# Patient Record
Sex: Female | Born: 1965 | Race: White | Hispanic: No | Marital: Married | Smoking: Former smoker
Health system: Southern US, Community
[De-identification: ages and names within clinical notes are randomized; demographics above are authoritative.]

## PROBLEM LIST (undated history)

## (undated) DIAGNOSIS — G2581 Restless legs syndrome: Secondary | ICD-10-CM

## (undated) DIAGNOSIS — I10 Essential (primary) hypertension: Secondary | ICD-10-CM

## (undated) DIAGNOSIS — I69398 Other sequelae of cerebral infarction: Principal | ICD-10-CM

## (undated) DIAGNOSIS — E119 Type 2 diabetes mellitus without complications: Secondary | ICD-10-CM

## (undated) DIAGNOSIS — K219 Gastro-esophageal reflux disease without esophagitis: Secondary | ICD-10-CM

## (undated) DIAGNOSIS — D509 Iron deficiency anemia, unspecified: Secondary | ICD-10-CM

## (undated) DIAGNOSIS — R000 Tachycardia, unspecified: Secondary | ICD-10-CM

## (undated) DIAGNOSIS — I69359 Hemiplegia and hemiparesis following cerebral infarction affecting unspecified side: Secondary | ICD-10-CM

## (undated) HISTORY — DX: Other sequelae of cerebral infarction: I69.398

## (undated) HISTORY — DX: Essential (primary) hypertension: I10

## (undated) HISTORY — DX: Hemiplegia and hemiparesis following cerebral infarction affecting unspecified side: I69.359

## (undated) HISTORY — DX: Restless legs syndrome: G25.81

## (undated) HISTORY — DX: Type 2 diabetes mellitus without complications: E11.9

## (undated) HISTORY — DX: Iron deficiency anemia, unspecified: D50.9

## (undated) HISTORY — DX: Gastro-esophageal reflux disease without esophagitis: K21.9

## (undated) HISTORY — DX: Tachycardia, unspecified: R00.0

---

## 2012-11-21 ENCOUNTER — Encounter (INDEPENDENT_AMBULATORY_CARE_PROVIDER_SITE_OTHER): Payer: Self-pay

## 2012-11-21 DIAGNOSIS — D509 Iron deficiency anemia, unspecified: Secondary | ICD-10-CM

## 2012-11-21 DIAGNOSIS — I890 Lymphedema, not elsewhere classified: Secondary | ICD-10-CM

## 2012-11-26 DIAGNOSIS — D509 Iron deficiency anemia, unspecified: Secondary | ICD-10-CM

## 2012-12-03 DIAGNOSIS — D509 Iron deficiency anemia, unspecified: Secondary | ICD-10-CM

## 2015-06-02 ENCOUNTER — Other Ambulatory Visit: Payer: Self-pay | Admitting: *Deleted

## 2015-06-02 ENCOUNTER — Encounter: Payer: Self-pay | Admitting: *Deleted

## 2015-06-03 ENCOUNTER — Encounter: Payer: Self-pay | Admitting: *Deleted

## 2015-06-03 ENCOUNTER — Ambulatory Visit (INDEPENDENT_AMBULATORY_CARE_PROVIDER_SITE_OTHER): Payer: BLUE CROSS/BLUE SHIELD | Admitting: Cardiovascular Disease

## 2015-06-03 ENCOUNTER — Encounter: Payer: Self-pay | Admitting: Cardiovascular Disease

## 2015-06-03 VITALS — BP 122/76 | HR 102 | Ht 67.5 in | Wt 242.0 lb

## 2015-06-03 DIAGNOSIS — I25118 Atherosclerotic heart disease of native coronary artery with other forms of angina pectoris: Secondary | ICD-10-CM | POA: Diagnosis not present

## 2015-06-03 DIAGNOSIS — R0602 Shortness of breath: Secondary | ICD-10-CM

## 2015-06-03 DIAGNOSIS — R5383 Other fatigue: Secondary | ICD-10-CM

## 2015-06-03 DIAGNOSIS — R000 Tachycardia, unspecified: Secondary | ICD-10-CM

## 2015-06-03 DIAGNOSIS — I10 Essential (primary) hypertension: Secondary | ICD-10-CM

## 2015-06-03 DIAGNOSIS — D638 Anemia in other chronic diseases classified elsewhere: Secondary | ICD-10-CM

## 2015-06-03 DIAGNOSIS — E785 Hyperlipidemia, unspecified: Secondary | ICD-10-CM

## 2015-06-03 DIAGNOSIS — Z955 Presence of coronary angioplasty implant and graft: Secondary | ICD-10-CM

## 2015-06-03 MED ORDER — ASPIRIN EC 81 MG PO TBEC: 81.0000 mg | DELAYED_RELEASE_TABLET | Freq: Every day | ORAL | Status: DC

## 2015-06-03 NOTE — Progress Notes (Signed)
Patient ID: Rachel Rivers, female   DOB: 1965/11/25, 50 y.o.   MRN: [id number]       CARDIOLOGY CONSULT NOTE  Patient ID: Rachel Rivers MRN: [id number] DOB/AGE: [birth year] 50 y.o.  Admit date: (Not on file) Primary Physician Selinda Flavin, MD  Reason for Consultation: tachycardia, fatigue  HPI: The patient is a 50 year old obese female with a history of hypertension and insulin-dependent diabetes mellitus who is referred for the evaluation of fatigue, SOB, and tachycardia. She also has anemia. She has CAD with a h/o LAD and left circumflex stents.  Labs on 05/13/15 showed Hgb 9.3, plts 370, BUN 27, creatinine 0.99, K 4, Hgb A1C 8.5%, TSH 5.7, TC 178, TG 666, HDL 27.  She received iron last night and feels a mild increase in both energy levels and decreased shortness of breath. She said she had 5 coronary angiograms with the most recent one being 10 years ago at which time she had a second stent placed in her left circumflex for reocclusion. All of these were performed in [place] and [place], [place]. She moved to [place] 4 years ago. Prior to stent placement, symptoms included shortness of breath, nausea, diaphoresis, and chest pain. She had gastric bypass surgery several years ago. She had 6 feet of intestines removed and his had malabsorption problems ever since then. 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  ECG performed in the office today and she was normal sinus rhythm with no ischemic ST segment or T-wave abnormalities, heart rate 93 bpm.  Fam: Father died of 4th MI at 54, 1st MI at 9.    Allergies  Allergen Reactions  . Iodinated Diagnostic Agents Hives  . Nsaids   . Statins Other (See Comments)    Myalgias:atorvastatin    Current Outpatient Prescriptions  Medication Sig Dispense Refill  . aspirin 325 MG tablet Take 1 tablet by mouth daily.    .  clopidogrel (PLAVIX) 75 MG tablet Take 75 mg by mouth daily.  99  . diltiazem (CARDIZEM CD) 240 MG 24 hr capsule Take 240 mg by mouth every morning.  3  . glipiZIDE (GLUCOTROL XL) 10 MG 24 hr tablet Take 10 mg by mouth every morning.  99  . hydrochlorothiazide (HYDRODIURIL) 25 MG tablet Take 25 mg by mouth daily.  99  . HYDROcodone-acetaminophen (NORCO/VICODIN) 5-325 MG tablet Take 1 tablet by mouth every 6 (six) hours as needed.   0  . LANTUS SOLOSTAR 100 UNIT/ML Solostar Pen Inject 60 Units into the skin 2 (two) times daily.  99  . metFORMIN (GLUCOPHAGE) 1000 MG tablet Take 1,000 mg by mouth 2 (two) times daily.  99  . metoprolol succinate (TOPROL-XL) 100 MG 24 hr tablet Take 150 mg by mouth 2 (two) times daily. 200 mg am / 100 mg pm  99  . Omega-3 Fatty Acids (FISH OIL) 1200 MG CPDR Take by mouth.    . pantoprazole (PROTONIX) 40 MG tablet Take 40 mg by mouth 2 (two) times daily.  99  . quinapril (ACCUPRIL) 20 MG tablet Take 20 mg by mouth daily.  99  . simvastatin (ZOCOR) 20 MG tablet Take 20 mg by mouth at bedtime.  3   No current facility-administered medications for this visit.    Past Medical History  Diagnosis Date  . Type 2 diabetes mellitus (HCC)   . Restless leg syndrome   .  Hypertension   . GERD (gastroesophageal reflux disease)   . Tachycardia   . Iron deficiency anemia     No past surgical history on file.  Social History   Social History  . Marital Status: Married    Spouse Name: N/A  . Number of Children: N/A  . Years of Education: N/A   Occupational History  . Not on file.   Social History Main Topics  . Smoking status: Former Smoker    Quit date: 03/04/1980  . Smokeless tobacco: Not on file  . Alcohol Use: Not on file  . Drug Use: Not on file  . Sexual Activity: Not on file   Other Topics Concern  . Not on file   Social History Narrative      Prior to Admission medications   Medication Sig Start Date End Date Taking? Authorizing Provider    aspirin 325 MG tablet Take 1 tablet by mouth daily.    Historical Provider, MD  clopidogrel (PLAVIX) 75 MG tablet Take 75 mg by mouth daily. 05/18/15   Historical Provider, MD  diltiazem (CARDIZEM CD) 240 MG 24 hr capsule Take 240 mg by mouth every morning. 05/31/15   Historical Provider, MD  glipiZIDE (GLUCOTROL XL) 10 MG 24 hr tablet Take 10 mg by mouth every morning. 05/18/15   Historical Provider, MD  hydrochlorothiazide (HYDRODIURIL) 25 MG tablet Take 25 mg by mouth daily. 05/06/15   Historical Provider, MD  HYDROcodone-acetaminophen (NORCO/VICODIN) 5-325 MG tablet Take 1 tablet by mouth 3 (three) times daily. 05/12/15   Historical Provider, MD  LANTUS SOLOSTAR 100 UNIT/ML Solostar Pen Inject 60 Units into the skin 2 (two) times daily. 05/31/15   Historical Provider, MD  metFORMIN (GLUCOPHAGE) 1000 MG tablet Take 1,000 mg by mouth 2 (two) times daily. 04/28/15   Historical Provider, MD  metoprolol succinate (TOPROL-XL) 100 MG 24 hr tablet Take 150 mg by mouth 2 (two) times daily. 05/31/15   Historical Provider, MD  pantoprazole (PROTONIX) 40 MG tablet Take 40 mg by mouth 2 (two) times daily. 05/06/15   Historical Provider, MD  quinapril (ACCUPRIL) 20 MG tablet Take 20 mg by mouth daily. 05/31/15   Historical Provider, MD  simvastatin (ZOCOR) 20 MG tablet Take 20 mg by mouth at bedtime. 05/11/15   Historical Provider, MD     Review of systems complete and found to be negative unless listed above in HPI     Physical exam Blood pressure 122/76, pulse 102, height 5' 7.5" (1.715 m), weight 242 lb (109.77 kg), SpO2 98 %. General: NAD Neck: No JVD, no thyromegaly or thyroid nodule.  Lungs: Clear to auscultation bilaterally with normal respiratory effort. CV: Nondisplaced PMI. Regular rate and rhythm, normal S1/S2, no S3/S4, no murmur.  No peripheral edema.  No carotid bruit.    Abdomen: Soft, nontender, obese.  Skin: Intact without lesions or rashes.  Neurologic: Alert and oriented x 3.  Psych: Normal  affect. Extremities: No clubbing or cyanosis.  HEENT: Normal.   ECG: Most recent ECG reviewed.  Labs:  No results found for: WBC, HGB, HCT, MCV, PLT No results for input(s): NA, K, CL, CO2, BUN, CREATININE, CALCIUM, PROT, BILITOT, ALKPHOS, ALT, AST, GLUCOSE in the last 168 hours.  Invalid input(s): LABALBU No results found for: CKTOTAL, CKMB, CKMBINDEX, TROPONINI No results found for: CHOL No results found for: HDL No results found for: LDLCALC No results found for: TRIG No results found for: CHOLHDL No results found for: LDLDIRECT  Studies: No results found.  ASSESSMENT AND PLAN:  1. Fatigue and SOB in context of CAD with LAD and LCx stents: As it has been 10 years since stent placement, will proceed with Lexiscan Cardiolite stress test to evaluate for ischemia. Will reduce ASA to 81 mg daily. Continue statin and metoprolol. Symptoms may also be related to anemia.  2. Essential HTN: Controlled. No changes.  3. Dyslipidemia: On simvastatin and fish oil. Needs exercise and weight loss.  4. Tachycardia: On Toprol-XL 150 mg BID and Cardizem CD 240 mg daily.   Dispo: fu 6 weeks.   Signed: Prentice DockerSuresh Shyvonne Chastang, M.D., F.A.C.C.  06/03/2015, 2:53 PM

## 2015-06-03 NOTE — Patient Instructions (Signed)
Your physician has recommended you make the following change in your medication:  Decrease aspirin to 81 mg daily. Continue all other medications the same. Your physician has requested that you have a lexiscan myoview. For further information please visit [email or web address] Please follow instruction sheet, as given. Your physician recommends that you schedule a follow-up appointment in: 6 weeks. Please sign a release of medical records form before leaving the office today.

## 2015-06-11 ENCOUNTER — Telehealth: Payer: Self-pay | Admitting: Cardiovascular Disease

## 2015-06-11 NOTE — Telephone Encounter (Signed)
Noted  

## 2015-06-11 NOTE — Telephone Encounter (Signed)
Mrs. Rachel Rivers called the office stating that she is still awaiting her new work schedule. As soon as she gets that she will call the office to schedule her Lexiscan and a 6 week follow up.

## 2016-03-01 ENCOUNTER — Encounter: Payer: Self-pay | Admitting: Cardiovascular Disease

## 2016-03-01 ENCOUNTER — Ambulatory Visit (INDEPENDENT_AMBULATORY_CARE_PROVIDER_SITE_OTHER): Payer: BLUE CROSS/BLUE SHIELD | Admitting: Cardiovascular Disease

## 2016-03-01 VITALS — BP 158/82 | HR 72 | Ht 68.0 in | Wt 239.0 lb

## 2016-03-01 DIAGNOSIS — E785 Hyperlipidemia, unspecified: Secondary | ICD-10-CM

## 2016-03-01 DIAGNOSIS — I639 Cerebral infarction, unspecified: Secondary | ICD-10-CM

## 2016-03-01 DIAGNOSIS — R000 Tachycardia, unspecified: Secondary | ICD-10-CM

## 2016-03-01 DIAGNOSIS — Z955 Presence of coronary angioplasty implant and graft: Secondary | ICD-10-CM

## 2016-03-01 DIAGNOSIS — Z951 Presence of aortocoronary bypass graft: Secondary | ICD-10-CM

## 2016-03-01 DIAGNOSIS — I10 Essential (primary) hypertension: Secondary | ICD-10-CM

## 2016-03-01 MED ORDER — VALSARTAN 80 MG PO TABS: 80.0000 mg | ORAL_TABLET | Freq: Every day | ORAL | 6 refills | Status: DC

## 2016-03-01 NOTE — Patient Instructions (Addendum)
Medication Instructions:   Begin Diovan 80mg  daily.  Continue all other medications.    Labwork:  BMET - due on Monday.    Office will contact with results via phone or letter.    Testing/Procedures: none  Referrals: Cardiac Rehab  Follow-Up: Your physician wants you to follow up in:  4 months.  You will receive a reminder letter in the mail one-two months in advance.  If you don't receive a letter, please call our office to schedule the follow up appointment   Any Other Special Instructions Will Be Listed Below (If Applicable).  If you need a refill on your cardiac medications before your next appointment, please call your pharmacy.

## 2016-03-01 NOTE — Progress Notes (Signed)
SUBJECTIVE: The patient presents for follow-up of coronary artery disease. I last saw her in April 2017. She was supposed to have undergone a stress test at that time but she never had it done. Apparently she had stress and anxiety related to having a stress test and also due to personal financial reasons.  She underwent four-vessel coronary artery bypass graft surgery on 12/10/15. She also has a history of CVA which occurred 2 days after bypass surgery.  She currently denies chest pain and shortness of breath. Systolic blood pressures at home have been in the 140 mmHg range. She wears compression stockings for lower extremity lymphedema.  Overall she said she is feeling much better.    Review of Systems: As per "subjective", otherwise negative.  Allergies  Allergen Reactions  . Iodinated Diagnostic Agents Hives  . Nsaids   . Statins Other (See Comments)    Myalgias:atorvastatin    Current Outpatient Prescriptions  Medication Sig Dispense Refill  . aspirin 325 MG tablet Take 325 mg by mouth daily.    Marland Kitchen. atorvastatin (LIPITOR) 40 MG tablet Take 40 mg by mouth daily.    . clopidogrel (PLAVIX) 75 MG tablet Take 75 mg by mouth daily.  99  . glipiZIDE (GLUCOTROL) 5 MG tablet Take 5 mg by mouth daily before breakfast.    . hydrochlorothiazide (HYDRODIURIL) 25 MG tablet Take 25 mg by mouth daily.  99  . HYDROcodone-acetaminophen (NORCO/VICODIN) 5-325 MG tablet Take 1 tablet by mouth every 6 (six) hours as needed.   0  . LANTUS SOLOSTAR 100 UNIT/ML Solostar Pen Inject 50 Units into the skin 2 (two) times daily.   99  . metoprolol (TOPROL-XL) 200 MG 24 hr tablet Take 200 mg by mouth daily.    . Omega-3 Fatty Acids (FISH OIL) 1200 MG CPDR Take by mouth.    . pantoprazole (PROTONIX) 40 MG tablet Take 40 mg by mouth 2 (two) times daily.  99   No current facility-administered medications for this visit.     Past Medical History:  Diagnosis Date  . GERD (gastroesophageal reflux  disease)   . Hypertension   . Iron deficiency anemia   . Restless leg syndrome   . Tachycardia   . Type 2 diabetes mellitus (HCC)     No past surgical history on file.  Social History   Social History  . Marital status: Married    Spouse name: N/A  . Number of children: N/A  . Years of education: N/A   Occupational History  . Not on file.   Social History Main Topics  . Smoking status: Former Smoker    Quit date: 03/04/1980  . Smokeless tobacco: Never Used  . Alcohol use Not on file  . Drug use: Unknown  . Sexual activity: Not on file   Other Topics Concern  . Not on file   Social History Narrative  . No narrative on file     Vitals:   03/01/16 1524  BP: (!) 158/82  Pulse: 72  SpO2: 98%  Weight: 239 lb (108.4 kg)  Height: 5\' 8"  (1.727 m)    PHYSICAL EXAM General: NAD HEENT: Normal. Neck: No JVD, no thyromegaly. Lungs: Clear to auscultation bilaterally with normal respiratory effort. CV: Nondisplaced PMI.  Regular rate and rhythm, normal S1/S2, no S3/S4, no murmur. Wearing compression stockings.  No carotid bruit.   Abdomen: Soft, obese  Neurologic: Alert and oriented.  Psych: Normal affect. Skin: Normal.     ECG:  Most recent ECG reviewed.      ASSESSMENT AND PLAN: 1. CAD with 4-vessel CABG in 11/2015 with prior h/o LAD and LCx stents: Symptomatically stable. Continue aspirin, Plavix, Lipitor, and metoprolol. Enroll in cardiac rehabilitation.  2. Essential HTN: Elevated. I will add Diovan 80 mg. Check BMET within one week.  3. Dyslipidemia: Continue Lipitor.  4. Tachycardia: Continue Toprol-XL.   5. CVA: On Plavix and ASA.  Dispo: fu 4 mths  Time spent: 40 minutes, of which greater than 50% was spent reviewing symptoms, relevant blood tests and studies, and discussing management plan with the patient.   Prentice Docker, M.D., F.A.C.C.

## 2016-03-02 ENCOUNTER — Telehealth: Payer: Self-pay | Admitting: *Deleted

## 2016-03-02 NOTE — Telephone Encounter (Signed)
Patient seen in office yesterday evening by Dr. Hennie DuosKonwswaran.  He wanted her to begin Diovan 80mg  daily.  Stated that she really did not feel comfortable beginning this medication.  Stated that she is concerned because she is on Potassium & diuretics and this medication is contraindicated per all information she has read.  She would like to try the Toprol XL at 150mg  twice a day like Dr. Dimas AguasHoward had her on in the past.  Stated that her BP was very controlled with that regimen.

## 2016-03-02 NOTE — Telephone Encounter (Signed)
Patient of Dr. Purvis SheffieldKoneswaran seen yesterday, I reviewed the note. Recommendation was to initiate Diovan for better blood pressure control, a reasonable choice. It seems that she has concerns related to concurrent use of potassium and diuretics, and certainly renal function and potassium levels would need to be followed. Creatinine was normal based on recent lab work, so Diovan would not be specifically contraindicated. Dr. Purvis SheffieldKoneswaran has already planned to check a BMET. There is no urgency to start the Diovan tonight, so I would recommend that these concerns be addressed with Dr. Purvis SheffieldKoneswaran tomorrow on his return to the office.

## 2016-03-03 MED ORDER — QUINAPRIL HCL 5 MG PO TABS: 5.0000 mg | ORAL_TABLET | Freq: Every day | ORAL | 6 refills | Status: DC

## 2016-03-03 NOTE — Telephone Encounter (Signed)
Up to her. Diovan has better data.

## 2016-03-03 NOTE — Telephone Encounter (Signed)
Patient returned call

## 2016-03-03 NOTE — Telephone Encounter (Signed)
Patient notified.  She is questioning about going on Accupril instead of the Diovan.  Stated that she had been on this in the past and tolerated without issue.

## 2016-03-03 NOTE — Telephone Encounter (Signed)
Mailbox full cannot accept messages

## 2016-03-03 NOTE — Telephone Encounter (Signed)
Per further discussion with Dr. Abigail ButtsKonewaran - can try Accupril at 5mg  daily.

## 2016-03-03 NOTE — Telephone Encounter (Signed)
As I stated during her office visit, ACEI/ARB is preferred given her h/o CABG. Beta blockers are not first line medications for the treatment of hypertension. As I also stated at her office visit, I will be monitoring her renal function. It is not contraindicated.

## 2016-03-03 NOTE — Telephone Encounter (Addendum)
Patient notified.   She will do the Accupril 5mg  daily.  New medication sent to [facility] now.

## 2016-03-13 ENCOUNTER — Telehealth: Payer: Self-pay | Admitting: *Deleted

## 2016-03-13 NOTE — Telephone Encounter (Signed)
Called patient with test results. No answer. Left message to call back.  

## 2016-03-13 NOTE — Telephone Encounter (Signed)
-----   Message from Laqueta LindenSuresh A Koneswaran, MD sent at 03/13/2016  3:51 PM EST ----- Ok.

## 2016-03-23 ENCOUNTER — Ambulatory Visit: Payer: BLUE CROSS/BLUE SHIELD | Admitting: Cardiovascular Disease

## 2016-04-20 ENCOUNTER — Encounter (HOSPITAL_COMMUNITY): Payer: 59

## 2016-05-01 ENCOUNTER — Encounter (HOSPITAL_COMMUNITY): Payer: 59

## 2016-10-05 ENCOUNTER — Ambulatory Visit (INDEPENDENT_AMBULATORY_CARE_PROVIDER_SITE_OTHER): Payer: 59 | Admitting: Cardiovascular Disease

## 2016-10-05 VITALS — BP 101/65 | HR 80 | Ht 68.0 in | Wt 225.8 lb

## 2016-10-05 DIAGNOSIS — Z8673 Personal history of transient ischemic attack (TIA), and cerebral infarction without residual deficits: Secondary | ICD-10-CM

## 2016-10-05 DIAGNOSIS — E785 Hyperlipidemia, unspecified: Secondary | ICD-10-CM | POA: Diagnosis not present

## 2016-10-05 DIAGNOSIS — Z951 Presence of aortocoronary bypass graft: Secondary | ICD-10-CM | POA: Diagnosis not present

## 2016-10-05 DIAGNOSIS — I10 Essential (primary) hypertension: Secondary | ICD-10-CM | POA: Diagnosis not present

## 2016-10-05 DIAGNOSIS — I25708 Atherosclerosis of coronary artery bypass graft(s), unspecified, with other forms of angina pectoris: Secondary | ICD-10-CM | POA: Diagnosis not present

## 2016-10-05 DIAGNOSIS — R000 Tachycardia, unspecified: Secondary | ICD-10-CM | POA: Diagnosis not present

## 2016-10-05 MED ORDER — ASPIRIN EC 81 MG PO TBEC: 81.0000 mg | DELAYED_RELEASE_TABLET | Freq: Every day | ORAL | Status: AC

## 2016-10-05 NOTE — Patient Instructions (Signed)
Medication Instructions:   Decrease Aspirin to 81mg daily.  Continue all other medications.    Labwork: none  Testing/Procedures: none  Follow-Up: Your physician wants you to follow up in:  1 year.  You will receive a reminder letter in the mail one-two months in advance.  If you don't receive a letter, please call our office to schedule the follow up appointment   Any Other Special Instructions Will Be Listed Below (If Applicable).  If you need a refill on your cardiac medications before your next appointment, please call your pharmacy.  

## 2016-10-05 NOTE — Progress Notes (Signed)
SUBJECTIVE: The patient presents for routine follow-up of coronary artery disease. She underwent four-vessel coronary artery bypass graft surgery on 12/10/15. She also has a history of CVA which occurred 2 days after bypass surgery.  She is doing very well and has made drastic lifestyle changes. She now walks 3-4 miles on a daily basis. She has walked 500,000 steps since her CVA. She has lost 14 pounds since her last visit with me. She has made strict dietary changes and eats Rice Krispies and skim milk for breakfast. She eats nonfat or very low saturated fat foods.    Review of Systems: As per "subjective", otherwise negative.  Allergies  Allergen Reactions  . Mushroom Extract Complex Anaphylaxis  . Iodinated Diagnostic Agents Hives  . Nsaids   . Statins Other (See Comments)    Myalgias:atorvastatin    Current Outpatient Prescriptions  Medication Sig Dispense Refill  . aspirin 325 MG tablet Take 325 mg by mouth daily.    Marland Kitchen atorvastatin (LIPITOR) 40 MG tablet Take 40 mg by mouth daily.    Marland Kitchen CARAFATE 1 GM/10ML suspension TAKE FOUR TEASPOONSFUL ( ) BY MOUTH FOUR TIMES DAILY.  0  . glipiZIDE (GLUCOTROL) 5 MG tablet Take 5 mg by mouth daily before breakfast.    . HUMALOG KWIKPEN 100 UNIT/ML KiwkPen See admin instructions.  12  . hydrochlorothiazide (HYDRODIURIL) 25 MG tablet Take 25 mg by mouth daily.  99  . LEVEMIR FLEXTOUCH 100 UNIT/ML Pen INJECT 40 UNITS AS DIRECTED TWICE DAILY.  12  . metoprolol (TOPROL-XL) 200 MG 24 hr tablet Take 200 mg by mouth daily.    . nitroGLYCERIN (NITROLINGUAL) 0.4 MG/SPRAY spray PLACE ONE SPRAY UNDER THE TONGUE EVERY 5 MINUTES AS NEEDED FOR CHEST PAIN  99  . Omega-3 Fatty Acids (FISH OIL) 1200 MG CPDR Take by mouth.    . ondansetron (ZOFRAN) 4 MG tablet Take 4 mg by mouth every 8 (eight) hours as needed. for nausea  3  . pantoprazole (PROTONIX) 40 MG tablet Take 40 mg by mouth 2 (two) times daily.  99  . PARoxetine (PAXIL) 20 MG tablet Take 20  mg by mouth daily.  99  . quinapril (ACCUPRIL) 5 MG tablet Take 1 tablet (5 mg total) by mouth daily. 30 tablet 6   No current facility-administered medications for this visit.     Past Medical History:  Diagnosis Date  . GERD (gastroesophageal reflux disease)   . Hypertension   . Iron deficiency anemia   . Restless leg syndrome   . Tachycardia   . Type 2 diabetes mellitus (HCC)     No past surgical history on file.  Social History   Social History  . Marital status: Married    Spouse name: N/A  . Number of children: N/A  . Years of education: N/A   Occupational History  . Not on file.   Social History Main Topics  . Smoking status: Former Smoker    Quit date: 03/04/1980  . Smokeless tobacco: Never Used  . Alcohol use Not on file  . Drug use: Unknown  . Sexual activity: Not on file   Other Topics Concern  . Not on file   Social History Narrative  . No narrative on file     Vitals:   10/05/16 1410  BP: 101/65  Pulse: 80  Weight: 225 lb 12.8 oz (102.4 kg)  Height: 5\' 8"  (1.727 m)    Wt Readings from Last 3 Encounters:  10/05/16 225 lb  12.8 oz (102.4 kg)  03/01/16 239 lb (108.4 kg)  06/03/15 242 lb (109.8 kg)     PHYSICAL EXAM General: NAD HEENT: Normal. Neck: No JVD, no thyromegaly. Lungs: Clear to auscultation bilaterally with normal respiratory effort. CV: Nondisplaced PMI.  Regular rate and rhythm, normal S1/S2, no S3/S4, no murmur. + lymphedema.  No carotid bruit.   Abdomen: Soft, nontender, no distention.  Neurologic: Alert and oriented.  Psych: Normal affect. Skin: Normal. Musculoskeletal: No gross deformities.    ECG: Most recent ECG reviewed.   Labs: No results found for: K, BUN, CREATININE, ALT, TSH, HGB   Lipids: No results found for: LDLCALC, LDLDIRECT, CHOL, TRIG, HDL     ASSESSMENT AND PLAN:  1. CAD with 4-vessel CABG in 11/2015 with prior h/o LAD and LCx stents: Symptomatically stable. Continue aspirin (reduce to 81 mg),  Lipitor, and metoprolol.   2. Essential HTN: Controlled. No changes.  3. Dyslipidemia: Continue Lipitor.  4. Tachycardia: Continue Toprol-XL.   5. CVA: Will reduce ASA to 81 mg.     Disposition: Follow up 1 year.   Prentice DockerSuresh Channelle Bottger, M.D., F.A.C.C.

## 2016-10-12 ENCOUNTER — Other Ambulatory Visit: Payer: Self-pay | Admitting: Cardiovascular Disease

## 2017-03-04 ENCOUNTER — Observation Stay: Admission: AD | Admit: 2017-03-04 | Payer: 59 | Source: Other Acute Inpatient Hospital | Admitting: Internal Medicine

## 2017-03-06 ENCOUNTER — Ambulatory Visit: Payer: BLUE CROSS/BLUE SHIELD | Admitting: Neurology

## 2017-04-25 ENCOUNTER — Ambulatory Visit: Payer: BLUE CROSS/BLUE SHIELD | Admitting: Neurology

## 2017-04-25 ENCOUNTER — Encounter: Payer: Self-pay | Admitting: Neurology

## 2017-04-25 DIAGNOSIS — I69398 Other sequelae of cerebral infarction: Secondary | ICD-10-CM

## 2017-04-25 DIAGNOSIS — I69359 Hemiplegia and hemiparesis following cerebral infarction affecting unspecified side: Secondary | ICD-10-CM | POA: Diagnosis not present

## 2017-04-25 HISTORY — DX: Hemiplegia and hemiparesis following cerebral infarction affecting unspecified side: I69.359

## 2017-04-25 NOTE — Progress Notes (Signed)
Reason for visit: History of stroke  Referring physician: Dr. [name] is a 52 y.o. female  History of present illness:  Rachel Rivers is a 52 year old right-handed white female with a history of a stroke event that occurred in October 2017.  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  The patient underwent embolectomy.  No hemorrhage of the stroke was noted.  The patient had a left hemiparesis and left hemisensory deficit.  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  She lives at home with her husband, she is able to perform most activities of daily living, she is driving a car, she is able to bathe and dress herself, but she does need some help tying her shoes.  She has been left with dysfunction of the left arm, she has clumsiness of the left hand and has difficulty with fine motor control.  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  She does have some swelling in the left hand.  She has a foot drop on the left, she is able to walk without assistance, but she does have a cane to use when she is walking on uneven ground.  She has had some problems with restless leg syndrome, she uses CBD oil at night, 3 mg which seems to help.  She has had some palpitations of the heart, she had a 2-1/2-week cardiac monitor study that did not show atrial fibrillation, she did have episodes of SVT.  She remains on aspirin therapy.  The patient will see a chiropractor on a regular basis which also has helped her discomfort some.  The patient may fall on occasion, the last fall was about 1 month ago.  The patient denies any problems with swallowing or choking.  She does have some residual mild dysarthria.  She comes to this office  for further evaluation.  The patient does have a history of diabetes and hypertension.  Past Medical History:  Diagnosis Date  . GERD (gastroesophageal reflux disease)   . 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 Hypertension   . Iron deficiency anemia   . Restless leg syndrome   . Tachycardia   . Type 2 diabetes mellitus (HCC)     History reviewed. No pertinent surgical history.  History reviewed. No pertinent family history.  Social history:  reports that she quit smoking about 37 years ago. she has never used smokeless tobacco. Her alcohol and drug histories are not on file.  Medications:  Prior to Admission medications   Medication Sig Start Date End Date Taking? Authorizing Provider  aspirin EC 81 MG tablet Take 1 tablet (81 mg total) by mouth daily. 10/05/16  Yes Laqueta Linden, MD  atorvastatin (LIPITOR) 40 MG tablet Take 40 mg by mouth daily.   Yes [provider]  Calcium Carbonate-Vitamin D (CALCIUM-D PO) Take by mouth.   Yes [provider]  Cholecalciferol (VITAMIN D PO) Take 100 Units by mouth daily.   Yes [provider]  diphenhydrAMINE (BENADRYL) 50 MG tablet Take 50 mg by mouth at bedtime.   Yes [provider]  docusate sodium (COLACE) 100 MG capsule Take 100  mg by mouth daily.   Yes [provider]  glipiZIDE (GLUCOTROL) 5 MG tablet Take 5 mg by mouth 2 (two) times daily before a meal.    Yes [provider]  HUMALOG KWIKPEN 100 UNIT/ML KiwkPen 8-20 Units See admin instructions.  09/19/16  Yes [provider]  hydrochlorothiazide (HYDRODIURIL) 25 MG tablet Take 25 mg by mouth daily. 05/06/15  Yes [provider]  LEVEMIR FLEXTOUCH 100 UNIT/ML Pen INJECT 68 UNITS AS DIRECTED TWICE DAILY. 09/21/16  Yes [provider]  metoprolol (TOPROL-XL) 200 MG 24 hr tablet Take 200 mg by mouth daily.   Yes [provider]  nitroGLYCERIN  (NITROLINGUAL) 0.4 MG/SPRAY spray PLACE ONE SPRAY UNDER THE TONGUE EVERY 5 MINUTES AS NEEDED FOR CHEST PAIN 08/07/16  Yes [provider]  NON FORMULARY CBD Oil, as directed   Yes [provider]  Omega-3 Fatty Acids (FISH OIL) 1200 MG CPDR Take by mouth daily.    Yes [provider]  ondansetron (ZOFRAN) 4 MG tablet Take 4 mg by mouth every 8 (eight) hours as needed. for nausea 07/25/16  Yes [provider]  pantoprazole (PROTONIX) 40 MG tablet Take 40 mg by mouth 2 (two) times daily. 05/06/15  Yes [provider]  PARoxetine (PAXIL) 10 MG tablet Take 10 mg by mouth daily.   Yes [provider]  polyethylene glycol (MIRALAX / GLYCOLAX) packet Take 17 g by mouth daily as needed.   Yes [provider]  quinapril (ACCUPRIL) 5 MG tablet TAKE ONE TABLET BY MOUTH DAILY. STOP DIOVAN 10/12/16  Yes Laqueta LindenKoneswaran, Suresh A, MD  sucralfate (CARAFATE) 1 g tablet Take by mouth 4 (four) times daily.  03/27/17  Yes [provider]      Allergies  Allergen Reactions  . Mushroom Extract Complex Anaphylaxis  . Iodinated Diagnostic Agents Hives  . Nsaids     ROS:  Out of a complete 14 system review of symptoms, the patient complains only of the following symptoms, and all other reviewed systems are negative.  Palpitations of the heart, swelling in the legs [place] Allergies Weakness Restless legs  Blood pressure 108/62, pulse 88, height 5' 7.5" (1.715 m), weight 224 lb (101.6 kg).  Physical Exam  General: The patient is alert and cooperative at the time of the examination.  Patient is moderately obese.  Eyes: Pupils are equal, round, and reactive to light. Discs are flat bilaterally.  Neck: The neck is supple, no carotid bruits are noted.  Respiratory: The respiratory examination is clear.  Cardiovascular: The cardiovascular examination reveals a regular rate and rhythm, no obvious murmurs or rubs are noted.  Skin:  Extremities are with 3+ edema below the knees bilaterally.  Neurologic Exam  Mental status: The patient is alert and oriented x 3 at the time of the examination. The patient has apparent normal recent and remote memory, with an apparently normal attention span and concentration ability.  Cranial nerves: Facial symmetry is not present.  There is decreased excursion of the lower face with smiling on the left.  Pinprick sensation on the lower face on the left is decreased.  The strength of the muscles to head turning and shoulder shrug are normal bilaterally. Speech is slightly dysarthric, not a phasic. Extraocular movements are full. Visual fields are full. The tongue is midline, and the patient has symmetric elevation of the soft palate. No obvious hearing deficits are noted.  Motor: The motor testing reveals 5 over 5 strength of the right  extremities.  On the left, the patient has some decreased grip with the left hand, she has good proximal strength with the left arm, she has a mild left foot drop, good proximal strength of the left leg.   Sensory: Sensory testing is intact to pinprick, soft touch, vibration sensation, and position sense the right extremities.  There is some slight decreased pinprick sensation on the left leg as compared to the right, symmetric in the arms.  Vibration sensation is decreased on the left foot, symmetric in the arms.  Extinction is noted on the left arm and left leg.  Coordination: Cerebellar testing reveals good finger-nose-finger and heel-to-shin on the right.  The patient appears to have some difficulty, slight ataxia with finger-nose-finger with the left arm, decreased excursion with heel shin on the left leg.  Gait and station: Gait is associated with a mild circumduction gait with the left leg, decreased arm swing with the left arm.  The patient is able to perform tandem gait.  Romberg is negative  Reflexes: Deep tendon reflexes are symmetric, but are depressed  bilaterally. Toes are downgoing bilaterally.   Assessment/Plan:  1.  Right brain stroke, left hemiparesis and hemisensory deficit  2.  Gait disorder  3.  Partial frozen shoulder, left  The patient is having some pain in the left arm associated with a partial frozen shoulder.  The patient is trying to do her own rehab, I will send her for physical therapy to work on increasing range of movement of the left shoulder, we will need to also get occupational therapy to work with coordination of the left hand.  The patient will remain on aspirin therapy.  She will follow-up in 6 months.  Marlan Palau MD 04/25/2017 9:09 AM  [facility] [address] [place], [place] [address]  Phone [phone] Fax [phone]

## 2017-04-25 NOTE — Patient Instructions (Signed)
   We will get OT and PT evaluation for the left arm and shoulder.

## 2017-05-12 ENCOUNTER — Other Ambulatory Visit: Payer: Self-pay | Admitting: Cardiovascular Disease

## 2017-10-23 ENCOUNTER — Encounter: Payer: Self-pay | Admitting: Neurology

## 2017-10-23 ENCOUNTER — Telehealth: Payer: Self-pay | Admitting: Neurology

## 2017-10-23 ENCOUNTER — Ambulatory Visit: Payer: BLUE CROSS/BLUE SHIELD | Admitting: Neurology

## 2017-10-23 NOTE — Telephone Encounter (Signed)
This patient did not show for a revisit appointment today. 

## 2017-10-26 ENCOUNTER — Other Ambulatory Visit: Payer: Self-pay | Admitting: Cardiovascular Disease

## 2018-02-07 ENCOUNTER — Other Ambulatory Visit: Payer: Self-pay | Admitting: Cardiovascular Disease

## 2018-02-09 ENCOUNTER — Other Ambulatory Visit: Payer: Self-pay | Admitting: Cardiovascular Disease

## 2018-03-14 ENCOUNTER — Other Ambulatory Visit: Payer: Self-pay | Admitting: Cardiovascular Disease

## 2018-04-15 ENCOUNTER — Other Ambulatory Visit: Payer: Self-pay | Admitting: Cardiovascular Disease

## 2018-06-04 DIAGNOSIS — E1165 Type 2 diabetes mellitus with hyperglycemia: Secondary | ICD-10-CM | POA: Diagnosis not present

## 2018-06-04 DIAGNOSIS — I10 Essential (primary) hypertension: Secondary | ICD-10-CM | POA: Diagnosis not present

## 2018-06-04 DIAGNOSIS — N182 Chronic kidney disease, stage 2 (mild): Secondary | ICD-10-CM | POA: Diagnosis not present

## 2018-06-04 DIAGNOSIS — E039 Hypothyroidism, unspecified: Secondary | ICD-10-CM | POA: Diagnosis not present

## 2018-06-04 DIAGNOSIS — K219 Gastro-esophageal reflux disease without esophagitis: Secondary | ICD-10-CM | POA: Diagnosis not present

## 2018-06-04 DIAGNOSIS — D509 Iron deficiency anemia, unspecified: Secondary | ICD-10-CM | POA: Diagnosis not present

## 2018-06-04 DIAGNOSIS — I5022 Chronic systolic (congestive) heart failure: Secondary | ICD-10-CM | POA: Diagnosis not present

## 2018-06-04 DIAGNOSIS — E7800 Pure hypercholesterolemia, unspecified: Secondary | ICD-10-CM | POA: Diagnosis not present

## 2018-06-06 DIAGNOSIS — I252 Old myocardial infarction: Secondary | ICD-10-CM | POA: Diagnosis not present

## 2018-06-06 DIAGNOSIS — D509 Iron deficiency anemia, unspecified: Secondary | ICD-10-CM | POA: Diagnosis not present

## 2018-06-06 DIAGNOSIS — I2510 Atherosclerotic heart disease of native coronary artery without angina pectoris: Secondary | ICD-10-CM | POA: Diagnosis not present

## 2018-06-06 DIAGNOSIS — Z6837 Body mass index (BMI) 37.0-37.9, adult: Secondary | ICD-10-CM | POA: Diagnosis not present

## 2018-06-06 DIAGNOSIS — I639 Cerebral infarction, unspecified: Secondary | ICD-10-CM | POA: Diagnosis not present

## 2018-06-06 DIAGNOSIS — I10 Essential (primary) hypertension: Secondary | ICD-10-CM | POA: Diagnosis not present

## 2018-06-06 DIAGNOSIS — K219 Gastro-esophageal reflux disease without esophagitis: Secondary | ICD-10-CM | POA: Diagnosis not present

## 2018-06-06 DIAGNOSIS — E039 Hypothyroidism, unspecified: Secondary | ICD-10-CM | POA: Diagnosis not present

## 2018-07-29 DIAGNOSIS — I4891 Unspecified atrial fibrillation: Secondary | ICD-10-CM | POA: Diagnosis not present

## 2018-07-29 DIAGNOSIS — Z79899 Other long term (current) drug therapy: Secondary | ICD-10-CM | POA: Diagnosis not present

## 2018-08-01 DIAGNOSIS — I472 Ventricular tachycardia: Secondary | ICD-10-CM | POA: Diagnosis not present

## 2018-08-02 DIAGNOSIS — E1159 Type 2 diabetes mellitus with other circulatory complications: Secondary | ICD-10-CM | POA: Diagnosis not present

## 2018-08-02 DIAGNOSIS — Z794 Long term (current) use of insulin: Secondary | ICD-10-CM | POA: Diagnosis not present

## 2018-08-02 DIAGNOSIS — R0683 Snoring: Secondary | ICD-10-CM | POA: Diagnosis not present

## 2018-08-02 DIAGNOSIS — I2510 Atherosclerotic heart disease of native coronary artery without angina pectoris: Secondary | ICD-10-CM | POA: Diagnosis not present

## 2018-08-02 DIAGNOSIS — I10 Essential (primary) hypertension: Secondary | ICD-10-CM | POA: Diagnosis not present

## 2018-08-02 DIAGNOSIS — I472 Ventricular tachycardia: Secondary | ICD-10-CM | POA: Diagnosis not present

## 2018-08-02 DIAGNOSIS — Z6841 Body Mass Index (BMI) 40.0 and over, adult: Secondary | ICD-10-CM | POA: Diagnosis not present

## 2018-08-02 DIAGNOSIS — T82198A Other mechanical complication of other cardiac electronic device, initial encounter: Secondary | ICD-10-CM | POA: Diagnosis not present

## 2018-09-23 DIAGNOSIS — R1031 Right lower quadrant pain: Secondary | ICD-10-CM | POA: Diagnosis not present

## 2018-09-23 DIAGNOSIS — D509 Iron deficiency anemia, unspecified: Secondary | ICD-10-CM | POA: Diagnosis not present

## 2018-09-23 DIAGNOSIS — Z6838 Body mass index (BMI) 38.0-38.9, adult: Secondary | ICD-10-CM | POA: Diagnosis not present

## 2018-09-23 DIAGNOSIS — I5022 Chronic systolic (congestive) heart failure: Secondary | ICD-10-CM | POA: Diagnosis not present

## 2018-09-23 DIAGNOSIS — I2510 Atherosclerotic heart disease of native coronary artery without angina pectoris: Secondary | ICD-10-CM | POA: Diagnosis not present

## 2018-09-29 DIAGNOSIS — K0499 Other diseases of pulp and periapical tissues: Secondary | ICD-10-CM | POA: Diagnosis not present

## 2018-10-08 DIAGNOSIS — I10 Essential (primary) hypertension: Secondary | ICD-10-CM | POA: Diagnosis not present

## 2018-10-08 DIAGNOSIS — K279 Peptic ulcer, site unspecified, unspecified as acute or chronic, without hemorrhage or perforation: Secondary | ICD-10-CM | POA: Diagnosis not present

## 2018-10-08 DIAGNOSIS — K219 Gastro-esophageal reflux disease without esophagitis: Secondary | ICD-10-CM | POA: Diagnosis not present

## 2018-11-06 DIAGNOSIS — Z4502 Encounter for adjustment and management of automatic implantable cardiac defibrillator: Secondary | ICD-10-CM | POA: Diagnosis not present

## 2018-11-06 DIAGNOSIS — I255 Ischemic cardiomyopathy: Secondary | ICD-10-CM | POA: Diagnosis not present

## 2018-11-06 DIAGNOSIS — I10 Essential (primary) hypertension: Secondary | ICD-10-CM | POA: Diagnosis not present

## 2018-11-06 DIAGNOSIS — I471 Supraventricular tachycardia: Secondary | ICD-10-CM | POA: Diagnosis not present

## 2018-11-06 DIAGNOSIS — I472 Ventricular tachycardia: Secondary | ICD-10-CM | POA: Diagnosis not present

## 2018-11-25 DIAGNOSIS — Z23 Encounter for immunization: Secondary | ICD-10-CM | POA: Diagnosis not present

## 2018-12-24 DIAGNOSIS — Z6838 Body mass index (BMI) 38.0-38.9, adult: Secondary | ICD-10-CM | POA: Diagnosis not present

## 2018-12-24 DIAGNOSIS — S025XXA Fracture of tooth (traumatic), initial encounter for closed fracture: Secondary | ICD-10-CM | POA: Diagnosis not present

## 2018-12-24 DIAGNOSIS — K047 Periapical abscess without sinus: Secondary | ICD-10-CM | POA: Diagnosis not present

## 2018-12-24 DIAGNOSIS — E1165 Type 2 diabetes mellitus with hyperglycemia: Secondary | ICD-10-CM | POA: Diagnosis not present

## 2018-12-24 DIAGNOSIS — I5022 Chronic systolic (congestive) heart failure: Secondary | ICD-10-CM | POA: Diagnosis not present

## 2019-01-08 DIAGNOSIS — I5022 Chronic systolic (congestive) heart failure: Secondary | ICD-10-CM | POA: Diagnosis not present

## 2019-01-08 DIAGNOSIS — I10 Essential (primary) hypertension: Secondary | ICD-10-CM | POA: Diagnosis not present

## 2019-01-08 DIAGNOSIS — I2510 Atherosclerotic heart disease of native coronary artery without angina pectoris: Secondary | ICD-10-CM | POA: Diagnosis not present

## 2019-01-08 DIAGNOSIS — K047 Periapical abscess without sinus: Secondary | ICD-10-CM | POA: Diagnosis not present

## 2019-01-08 DIAGNOSIS — Z6837 Body mass index (BMI) 37.0-37.9, adult: Secondary | ICD-10-CM | POA: Diagnosis not present

## 2019-01-08 DIAGNOSIS — E1165 Type 2 diabetes mellitus with hyperglycemia: Secondary | ICD-10-CM | POA: Diagnosis not present

## 2019-01-08 DIAGNOSIS — D509 Iron deficiency anemia, unspecified: Secondary | ICD-10-CM | POA: Diagnosis not present

## 2019-01-08 DIAGNOSIS — S025XXA Fracture of tooth (traumatic), initial encounter for closed fracture: Secondary | ICD-10-CM | POA: Diagnosis not present

## 2019-01-25 DIAGNOSIS — K0889 Other specified disorders of teeth and supporting structures: Secondary | ICD-10-CM | POA: Diagnosis not present

## 2019-02-03 DIAGNOSIS — Z20828 Contact with and (suspected) exposure to other viral communicable diseases: Secondary | ICD-10-CM | POA: Diagnosis not present

## 2019-02-05 DIAGNOSIS — I472 Ventricular tachycardia: Secondary | ICD-10-CM | POA: Diagnosis not present

## 2019-02-27 DIAGNOSIS — E1165 Type 2 diabetes mellitus with hyperglycemia: Secondary | ICD-10-CM | POA: Diagnosis not present

## 2019-02-27 DIAGNOSIS — I5022 Chronic systolic (congestive) heart failure: Secondary | ICD-10-CM | POA: Diagnosis not present

## 2019-03-06 DIAGNOSIS — E1169 Type 2 diabetes mellitus with other specified complication: Secondary | ICD-10-CM | POA: Diagnosis not present

## 2019-03-06 DIAGNOSIS — E11319 Type 2 diabetes mellitus with unspecified diabetic retinopathy without macular edema: Secondary | ICD-10-CM | POA: Diagnosis not present

## 2019-03-06 DIAGNOSIS — E1122 Type 2 diabetes mellitus with diabetic chronic kidney disease: Secondary | ICD-10-CM | POA: Diagnosis not present

## 2019-03-06 DIAGNOSIS — E1165 Type 2 diabetes mellitus with hyperglycemia: Secondary | ICD-10-CM | POA: Diagnosis not present

## 2019-03-06 DIAGNOSIS — E1140 Type 2 diabetes mellitus with diabetic neuropathy, unspecified: Secondary | ICD-10-CM | POA: Diagnosis not present

## 2019-03-06 DIAGNOSIS — N183 Chronic kidney disease, stage 3 unspecified: Secondary | ICD-10-CM | POA: Diagnosis not present

## 2019-03-06 DIAGNOSIS — Z794 Long term (current) use of insulin: Secondary | ICD-10-CM | POA: Diagnosis not present

## 2019-03-06 DIAGNOSIS — E785 Hyperlipidemia, unspecified: Secondary | ICD-10-CM | POA: Diagnosis not present

## 2019-03-06 DIAGNOSIS — E039 Hypothyroidism, unspecified: Secondary | ICD-10-CM | POA: Diagnosis not present

## 2019-03-18 DIAGNOSIS — L97522 Non-pressure chronic ulcer of other part of left foot with fat layer exposed: Secondary | ICD-10-CM | POA: Diagnosis not present

## 2019-03-18 DIAGNOSIS — E1142 Type 2 diabetes mellitus with diabetic polyneuropathy: Secondary | ICD-10-CM | POA: Diagnosis not present

## 2019-04-03 DIAGNOSIS — E039 Hypothyroidism, unspecified: Secondary | ICD-10-CM | POA: Diagnosis not present

## 2019-04-03 DIAGNOSIS — D649 Anemia, unspecified: Secondary | ICD-10-CM | POA: Diagnosis not present

## 2019-04-03 DIAGNOSIS — D529 Folate deficiency anemia, unspecified: Secondary | ICD-10-CM | POA: Diagnosis not present

## 2019-04-03 DIAGNOSIS — I5022 Chronic systolic (congestive) heart failure: Secondary | ICD-10-CM | POA: Diagnosis not present

## 2019-04-03 DIAGNOSIS — K219 Gastro-esophageal reflux disease without esophagitis: Secondary | ICD-10-CM | POA: Diagnosis not present

## 2019-04-03 DIAGNOSIS — I10 Essential (primary) hypertension: Secondary | ICD-10-CM | POA: Diagnosis not present

## 2019-04-03 DIAGNOSIS — D519 Vitamin B12 deficiency anemia, unspecified: Secondary | ICD-10-CM | POA: Diagnosis not present

## 2019-04-03 DIAGNOSIS — E1165 Type 2 diabetes mellitus with hyperglycemia: Secondary | ICD-10-CM | POA: Diagnosis not present

## 2019-04-03 DIAGNOSIS — E1122 Type 2 diabetes mellitus with diabetic chronic kidney disease: Secondary | ICD-10-CM | POA: Diagnosis not present

## 2019-04-03 DIAGNOSIS — N183 Chronic kidney disease, stage 3 unspecified: Secondary | ICD-10-CM | POA: Diagnosis not present

## 2019-04-08 DIAGNOSIS — Z6838 Body mass index (BMI) 38.0-38.9, adult: Secondary | ICD-10-CM | POA: Diagnosis not present

## 2019-04-08 DIAGNOSIS — I10 Essential (primary) hypertension: Secondary | ICD-10-CM | POA: Diagnosis not present

## 2019-04-08 DIAGNOSIS — I5022 Chronic systolic (congestive) heart failure: Secondary | ICD-10-CM | POA: Diagnosis not present

## 2019-04-08 DIAGNOSIS — I255 Ischemic cardiomyopathy: Secondary | ICD-10-CM | POA: Diagnosis not present

## 2019-04-08 DIAGNOSIS — I2510 Atherosclerotic heart disease of native coronary artery without angina pectoris: Secondary | ICD-10-CM | POA: Diagnosis not present

## 2019-04-08 DIAGNOSIS — E1122 Type 2 diabetes mellitus with diabetic chronic kidney disease: Secondary | ICD-10-CM | POA: Diagnosis not present

## 2019-04-08 DIAGNOSIS — E11319 Type 2 diabetes mellitus with unspecified diabetic retinopathy without macular edema: Secondary | ICD-10-CM | POA: Diagnosis not present

## 2019-04-08 DIAGNOSIS — E1165 Type 2 diabetes mellitus with hyperglycemia: Secondary | ICD-10-CM | POA: Diagnosis not present

## 2019-04-17 DIAGNOSIS — E1122 Type 2 diabetes mellitus with diabetic chronic kidney disease: Secondary | ICD-10-CM | POA: Diagnosis not present

## 2019-04-17 DIAGNOSIS — E039 Hypothyroidism, unspecified: Secondary | ICD-10-CM | POA: Diagnosis not present

## 2019-04-17 DIAGNOSIS — E785 Hyperlipidemia, unspecified: Secondary | ICD-10-CM | POA: Diagnosis not present

## 2019-04-17 DIAGNOSIS — E1165 Type 2 diabetes mellitus with hyperglycemia: Secondary | ICD-10-CM | POA: Diagnosis not present

## 2019-04-17 DIAGNOSIS — E1140 Type 2 diabetes mellitus with diabetic neuropathy, unspecified: Secondary | ICD-10-CM | POA: Diagnosis not present

## 2019-04-17 DIAGNOSIS — Z794 Long term (current) use of insulin: Secondary | ICD-10-CM | POA: Diagnosis not present

## 2019-04-17 DIAGNOSIS — N183 Chronic kidney disease, stage 3 unspecified: Secondary | ICD-10-CM | POA: Diagnosis not present

## 2019-04-17 DIAGNOSIS — E1169 Type 2 diabetes mellitus with other specified complication: Secondary | ICD-10-CM | POA: Diagnosis not present

## 2019-04-17 DIAGNOSIS — E11319 Type 2 diabetes mellitus with unspecified diabetic retinopathy without macular edema: Secondary | ICD-10-CM | POA: Diagnosis not present

## 2019-05-07 DIAGNOSIS — Z95810 Presence of automatic (implantable) cardiac defibrillator: Secondary | ICD-10-CM | POA: Diagnosis not present

## 2019-05-07 DIAGNOSIS — I472 Ventricular tachycardia: Secondary | ICD-10-CM | POA: Diagnosis not present

## 2019-05-31 ENCOUNTER — Inpatient Hospital Stay (HOSPITAL_COMMUNITY)
Admission: EM | Admit: 2019-05-31 | Discharge: 2019-06-04 | DRG: 280 | Disposition: A | Discharge status: Home / Self Care | Payer: Medicare HMO | Attending: Internal Medicine | Admitting: Internal Medicine

## 2019-05-31 ENCOUNTER — Other Ambulatory Visit: Payer: Self-pay

## 2019-05-31 ENCOUNTER — Emergency Department (HOSPITAL_COMMUNITY): Payer: Medicare HMO

## 2019-05-31 DIAGNOSIS — E039 Hypothyroidism, unspecified: Secondary | ICD-10-CM | POA: Diagnosis present

## 2019-05-31 DIAGNOSIS — D649 Anemia, unspecified: Secondary | ICD-10-CM

## 2019-05-31 DIAGNOSIS — N189 Chronic kidney disease, unspecified: Secondary | ICD-10-CM | POA: Diagnosis not present

## 2019-05-31 DIAGNOSIS — E1122 Type 2 diabetes mellitus with diabetic chronic kidney disease: Secondary | ICD-10-CM | POA: Diagnosis present

## 2019-05-31 DIAGNOSIS — Z955 Presence of coronary angioplasty implant and graft: Secondary | ICD-10-CM

## 2019-05-31 DIAGNOSIS — T82198A Other mechanical complication of other cardiac electronic device, initial encounter: Secondary | ICD-10-CM | POA: Diagnosis not present

## 2019-05-31 DIAGNOSIS — I2510 Atherosclerotic heart disease of native coronary artery without angina pectoris: Secondary | ICD-10-CM | POA: Diagnosis present

## 2019-05-31 DIAGNOSIS — Z79899 Other long term (current) drug therapy: Secondary | ICD-10-CM

## 2019-05-31 DIAGNOSIS — Z9884 Bariatric surgery status: Secondary | ICD-10-CM | POA: Diagnosis not present

## 2019-05-31 DIAGNOSIS — R946 Abnormal results of thyroid function studies: Secondary | ICD-10-CM | POA: Diagnosis present

## 2019-05-31 DIAGNOSIS — Y712 Prosthetic and other implants, materials and accessory cardiovascular devices associated with adverse incidents: Secondary | ICD-10-CM | POA: Diagnosis present

## 2019-05-31 DIAGNOSIS — Z6837 Body mass index (BMI) 37.0-37.9, adult: Secondary | ICD-10-CM

## 2019-05-31 DIAGNOSIS — I447 Left bundle-branch block, unspecified: Secondary | ICD-10-CM | POA: Diagnosis present

## 2019-05-31 DIAGNOSIS — E1165 Type 2 diabetes mellitus with hyperglycemia: Secondary | ICD-10-CM | POA: Diagnosis not present

## 2019-05-31 DIAGNOSIS — I10 Essential (primary) hypertension: Secondary | ICD-10-CM | POA: Diagnosis not present

## 2019-05-31 DIAGNOSIS — I5043 Acute on chronic combined systolic (congestive) and diastolic (congestive) heart failure: Secondary | ICD-10-CM | POA: Diagnosis present

## 2019-05-31 DIAGNOSIS — Z87891 Personal history of nicotine dependence: Secondary | ICD-10-CM

## 2019-05-31 DIAGNOSIS — R778 Other specified abnormalities of plasma proteins: Secondary | ICD-10-CM

## 2019-05-31 DIAGNOSIS — Z7901 Long term (current) use of anticoagulants: Secondary | ICD-10-CM

## 2019-05-31 DIAGNOSIS — I130 Hypertensive heart and chronic kidney disease with heart failure and stage 1 through stage 4 chronic kidney disease, or unspecified chronic kidney disease: Secondary | ICD-10-CM | POA: Diagnosis present

## 2019-05-31 DIAGNOSIS — Z20822 Contact with and (suspected) exposure to covid-19: Secondary | ICD-10-CM | POA: Diagnosis present

## 2019-05-31 DIAGNOSIS — R0989 Other specified symptoms and signs involving the circulatory and respiratory systems: Secondary | ICD-10-CM | POA: Diagnosis not present

## 2019-05-31 DIAGNOSIS — Z79890 Hormone replacement therapy (postmenopausal): Secondary | ICD-10-CM

## 2019-05-31 DIAGNOSIS — I255 Ischemic cardiomyopathy: Secondary | ICD-10-CM | POA: Diagnosis present

## 2019-05-31 DIAGNOSIS — E739 Lactose intolerance, unspecified: Secondary | ICD-10-CM | POA: Diagnosis present

## 2019-05-31 DIAGNOSIS — Z95810 Presence of automatic (implantable) cardiac defibrillator: Secondary | ICD-10-CM | POA: Diagnosis not present

## 2019-05-31 DIAGNOSIS — N179 Acute kidney failure, unspecified: Secondary | ICD-10-CM

## 2019-05-31 DIAGNOSIS — R079 Chest pain, unspecified: Secondary | ICD-10-CM | POA: Diagnosis not present

## 2019-05-31 DIAGNOSIS — R7989 Other specified abnormal findings of blood chemistry: Secondary | ICD-10-CM | POA: Diagnosis not present

## 2019-05-31 DIAGNOSIS — I471 Supraventricular tachycardia: Secondary | ICD-10-CM | POA: Diagnosis present

## 2019-05-31 DIAGNOSIS — K30 Functional dyspepsia: Secondary | ICD-10-CM | POA: Diagnosis present

## 2019-05-31 DIAGNOSIS — I340 Nonrheumatic mitral (valve) insufficiency: Secondary | ICD-10-CM | POA: Diagnosis not present

## 2019-05-31 DIAGNOSIS — I472 Ventricular tachycardia: Secondary | ICD-10-CM | POA: Diagnosis present

## 2019-05-31 DIAGNOSIS — R0902 Hypoxemia: Secondary | ICD-10-CM | POA: Diagnosis present

## 2019-05-31 DIAGNOSIS — I4891 Unspecified atrial fibrillation: Secondary | ICD-10-CM | POA: Diagnosis present

## 2019-05-31 DIAGNOSIS — I361 Nonrheumatic tricuspid (valve) insufficiency: Secondary | ICD-10-CM | POA: Diagnosis not present

## 2019-05-31 DIAGNOSIS — Z8249 Family history of ischemic heart disease and other diseases of the circulatory system: Secondary | ICD-10-CM

## 2019-05-31 DIAGNOSIS — T82855A Stenosis of coronary artery stent, initial encounter: Secondary | ICD-10-CM | POA: Diagnosis present

## 2019-05-31 DIAGNOSIS — N1832 Chronic kidney disease, stage 3b: Secondary | ICD-10-CM | POA: Diagnosis present

## 2019-05-31 DIAGNOSIS — Z823 Family history of stroke: Secondary | ICD-10-CM

## 2019-05-31 DIAGNOSIS — K219 Gastro-esophageal reflux disease without esophagitis: Secondary | ICD-10-CM | POA: Diagnosis present

## 2019-05-31 DIAGNOSIS — R0602 Shortness of breath: Secondary | ICD-10-CM

## 2019-05-31 DIAGNOSIS — G2581 Restless legs syndrome: Secondary | ICD-10-CM | POA: Diagnosis present

## 2019-05-31 DIAGNOSIS — I69354 Hemiplegia and hemiparesis following cerebral infarction affecting left non-dominant side: Secondary | ICD-10-CM

## 2019-05-31 DIAGNOSIS — Z7982 Long term (current) use of aspirin: Secondary | ICD-10-CM

## 2019-05-31 DIAGNOSIS — Z794 Long term (current) use of insulin: Secondary | ICD-10-CM

## 2019-05-31 DIAGNOSIS — I213 ST elevation (STEMI) myocardial infarction of unspecified site: Secondary | ICD-10-CM | POA: Diagnosis not present

## 2019-05-31 DIAGNOSIS — Z8719 Personal history of other diseases of the digestive system: Secondary | ICD-10-CM

## 2019-05-31 DIAGNOSIS — N183 Chronic kidney disease, stage 3 unspecified: Secondary | ICD-10-CM | POA: Diagnosis not present

## 2019-05-31 DIAGNOSIS — Z91041 Radiographic dye allergy status: Secondary | ICD-10-CM

## 2019-05-31 DIAGNOSIS — E669 Obesity, unspecified: Secondary | ICD-10-CM | POA: Diagnosis present

## 2019-05-31 DIAGNOSIS — I5022 Chronic systolic (congestive) heart failure: Secondary | ICD-10-CM | POA: Diagnosis not present

## 2019-05-31 DIAGNOSIS — I214 Non-ST elevation (NSTEMI) myocardial infarction: Secondary | ICD-10-CM | POA: Diagnosis present

## 2019-05-31 DIAGNOSIS — T82198D Other mechanical complication of other cardiac electronic device, subsequent encounter: Secondary | ICD-10-CM | POA: Diagnosis not present

## 2019-05-31 LAB — CBC WITH DIFFERENTIAL/PLATELET
Abs Immature Granulocytes: 0.06 10*3/uL (ref 0.00–0.07)
Basophils Absolute: 0.1 10*3/uL (ref 0.0–0.1)
Basophils Relative: 1 %
Eosinophils Absolute: 0 10*3/uL (ref 0.0–0.5)
Eosinophils Relative: 0 %
HCT: 35.3 % — ABNORMAL LOW (ref 36.0–46.0)
Hemoglobin: 10.4 g/dL — ABNORMAL LOW (ref 12.0–15.0)
Immature Granulocytes: 1 %
Lymphocytes Relative: 15 %
Lymphs Abs: 1.4 10*3/uL (ref 0.7–4.0)
MCH: 27 pg (ref 26.0–34.0)
MCHC: 29.5 g/dL — ABNORMAL LOW (ref 30.0–36.0)
MCV: 91.7 fL (ref 80.0–100.0)
Monocytes Absolute: 0.5 10*3/uL (ref 0.1–1.0)
Monocytes Relative: 5 %
Neutro Abs: 7.6 10*3/uL (ref 1.7–7.7)
Neutrophils Relative %: 78 %
Platelets: 298 10*3/uL (ref 150–400)
RBC: 3.85 MIL/uL — ABNORMAL LOW (ref 3.87–5.11)
RDW: 17.8 % — ABNORMAL HIGH (ref 11.5–15.5)
WBC: 9.7 10*3/uL (ref 4.0–10.5)
nRBC: 0 % (ref 0.0–0.2)

## 2019-05-31 MED ORDER — AMIODARONE HCL 200 MG PO TABS: 400.0000 mg | ORAL_TABLET | Freq: Three times a day (TID) | ORAL | Status: DC

## 2019-05-31 MED ORDER — AMIODARONE HCL 200 MG PO TABS: 400.0000 mg | ORAL_TABLET | Freq: Every day | ORAL | Status: DC

## 2019-05-31 NOTE — ED Triage Notes (Addendum)
Pt BIB [facility] with c/o of chest pain and defib firing x 5. Pt reports taking at home dose of Amiodarone 200 mg P.O, Metoprolol,  5 mg Eliquis, 3 Nitro SL, Zofran, 324 mg asprin.

## 2019-05-31 NOTE — ED Provider Notes (Signed)
[facility] EMERGENCY DEPARTMENT Provider Note   CSN: [id number] Arrival date & time: 05/31/19  2256     History Chief Complaint  Patient presents with  . Chest Pain  . Defib fire    Rachel Rivers is a 54 y.o. female.  Patient is a 54 year old female with extensive past medical history including diabetes, cardiomyopathy requiring pacer/defibrillator, coronary artery disease with CABG, prior stroke. She is followed by cardiology at [facility]. She is brought by EMS for evaluation of defibrillator firing. Patient was bending over cleaning the floor this evening and when she stood up received what she describes as 5 shocks from her defibrillator. She reports being compliant with her medications and denies any symptoms leading up to these shocks. She denies any chest pain or shortness of breath. She denies any fevers or chills.  The history is provided by the patient.       Past Medical History:  Diagnosis Date  . GERD (gastroesophageal reflux disease)   . Hemiparesis and alteration of sensations as late effects of stroke ([place]) 04/25/2017  . Hypertension   . Iron deficiency anemia   . Restless leg syndrome   . Tachycardia   . Type 2 diabetes mellitus [facility])     Patient Active Problem List   Diagnosis Date Noted  . Hemiparesis and alteration of sensations as late effects of stroke ([place]) 04/25/2017    No past surgical history on file.   OB History   No obstetric history on file.     No family history on file.  Social History   Tobacco Use  . Smoking status: Former Smoker    Quit date: 03/04/1980    Years since quitting: 39.2  . Smokeless tobacco: Never Used  Substance Use Topics  . Alcohol use: Not on file  . Drug use: Not on file    Home Medications Prior to Admission medications   Medication Sig Start Date End Date Taking? Authorizing Provider  aspirin EC 81 MG tablet Take 1 tablet (81 mg total) by mouth daily. 10/05/16   Herminio Commons, MD    atorvastatin (LIPITOR) 40 MG tablet Take 40 mg by mouth daily.    [provider]  Calcium Carbonate-Vitamin D (CALCIUM-D PO) Take by mouth.    [provider]  Cholecalciferol (VITAMIN D PO) Take 100 Units by mouth daily.    [provider]  diphenhydrAMINE (BENADRYL) 50 MG tablet Take 50 mg by mouth at bedtime.    [provider]  docusate sodium (COLACE) 100 MG capsule Take 100 mg by mouth daily.    [provider]  glipiZIDE (GLUCOTROL) 5 MG tablet Take 5 mg by mouth 2 (two) times daily before a meal.     [provider]  HUMALOG KWIKPEN 100 UNIT/ML KiwkPen 8-20 Units See admin instructions.  09/19/16   [provider]  hydrochlorothiazide (HYDRODIURIL) 25 MG tablet Take 25 mg by mouth daily. 05/06/15   [provider]  LEVEMIR FLEXTOUCH 100 UNIT/ML Pen INJECT 68 UNITS AS DIRECTED TWICE DAILY. 09/21/16   [provider]  metoprolol (TOPROL-XL) 200 MG 24 hr tablet Take 200 mg by mouth daily.    [provider]  nitroGLYCERIN (NITROLINGUAL) 0.4 MG/SPRAY spray PLACE ONE SPRAY UNDER THE TONGUE EVERY 5 MINUTES AS NEEDED FOR CHEST PAIN 08/07/16   [provider]  NON FORMULARY CBD Oil, as directed    [provider]  Omega-3 Fatty Acids (FISH OIL) 1200 MG CPDR Take by mouth  daily.     [provider]  ondansetron (ZOFRAN) 4 MG tablet Take 4 mg by mouth every 8 (eight) hours as needed. for nausea 07/25/16   [provider]  pantoprazole (PROTONIX) 40 MG tablet Take 40 mg by mouth 2 (two) times daily. 05/06/15   [provider]  PARoxetine (PAXIL) 10 MG tablet Take 10 mg by mouth daily.    [provider]  polyethylene glycol (MIRALAX / GLYCOLAX) packet Take 17 g by mouth daily as needed.    [provider]  quinapril (ACCUPRIL) 5 MG tablet TAKE ONE TABLET BY MOUTH DAILY. 03/14/18   Laqueta Linden, MD  sucralfate (CARAFATE) 1 g tablet Take by mouth 4  (four) times daily.  03/27/17   [provider]    Allergies    Mushroom extract complex, Iodinated diagnostic agents, and Nsaids  Review of Systems   Review of Systems  All other systems reviewed and are negative.   Physical Exam Updated Vital Signs BP 124/76   Pulse (!) 101   Temp 99.4 F (37.4 C) (Oral)   Resp 20   Ht 5\' 8"  (1.727 m)   Wt 112.5 kg   SpO2 97%   BMI 37.71 kg/m   Physical Exam Vitals and nursing note reviewed.  Constitutional:      General: She is not in acute distress.    Appearance: She is well-developed. She is not diaphoretic.  HENT:     Head: Normocephalic and atraumatic.  Cardiovascular:     Rate and Rhythm: Normal rate and regular rhythm.     Heart sounds: No murmur. No friction rub. No gallop.   Pulmonary:     Effort: Pulmonary effort is normal. No respiratory distress.     Breath sounds: Normal breath sounds. No wheezing.  Abdominal:     General: Bowel sounds are normal. There is no distension.     Palpations: Abdomen is soft.     Tenderness: There is no abdominal tenderness.  Musculoskeletal:        General: Normal range of motion.     Cervical back: Normal range of motion and neck supple.     Right lower leg: No tenderness. No edema.     Left lower leg: No tenderness. No edema.  Skin:    General: Skin is warm and dry.  Neurological:     Mental Status: She is alert and oriented to person, place, and time.     ED Results / Procedures / Treatments   Labs (all labs ordered are listed, but only abnormal results are displayed) Labs Reviewed  CBC WITH DIFFERENTIAL/PLATELET - Abnormal; Notable for the following components:      Result Value   RBC 3.85 (*)    Hemoglobin 10.4 (*)    HCT 35.3 (*)    MCHC 29.5 (*)    RDW 17.8 (*)    All other components within normal limits  BASIC METABOLIC PANEL  TSH  T4, FREE  TROPONIN I (HIGH SENSITIVITY)    EKG EKG Interpretation  Date/Time:  Saturday May 31 2019 22:55:07  EDT Ventricular Rate:  118 PR Interval:    QRS Duration: 175 QT Interval:  458 QTC Calculation: 642 R Axis:   -24 Text Interpretation: Ventricular Paced Rhythm Confirmed by 11-05-1998 (Geoffery Lyons) on 05/31/2019 11:54:06 PM   Radiology No results found.  Procedures Procedures (including critical care time)  Medications Ordered in ED Medications  amiodarone (PACERONE) tablet 400 mg (has no administration in time  range)    Followed by  amiodarone (PACERONE) tablet 400 mg (has no administration in time range)    ED Course  I have reviewed the triage vital signs and the nursing notes.  Pertinent labs & imaging results that were available during my care of the patient were reviewed by me and considered in my medical decision making (see chart for details).    MDM Rules/Calculators/A&P  Patient brought by [facility] for evaluation of chest pain after having received 5 shocks from her defibrillator.  Patient has extensive cardiac history including CABG and ischemic cardiomyopathy with dysrhythmia requiring AICD.    Patient's work-up reveals an unchanged EKG, however troponin did return elevated at 400.  Her defibrillator was interrogated by the [employer] has determined that she received 5 shocks related to sinus tachycardia, but did not see evidence for V. tach or V. Fib.  Second troponin has returned and is 3600, however she remains without chest pain or other symptoms.  I have discussed the care with Dr. Meredeth Ide from cardiology.  She is recommending admission by the hospitalist for trending of the patient's troponin.  She also feels as though the patient will require an echocardiogram in the morning and possibly EP consultation.  I have spoken with Dr. Loney Loh who agrees to admit.  CRITICAL CARE Performed by: Geoffery Lyons Total critical care time: 50 minutes Critical care time was exclusive of separately billable procedures and treating other  patients. Critical care was necessary to treat or prevent imminent or life-threatening deterioration. Critical care was time spent personally by me on the following activities: development of treatment plan with patient and/or surrogate as well as nursing, discussions with consultants, evaluation of patient's response to treatment, examination of patient, obtaining history from patient or surrogate, ordering and performing treatments and interventions, ordering and review of laboratory studies, ordering and review of radiographic studies, pulse oximetry and re-evaluation of patient's condition.   Final Clinical Impression(s) / ED Diagnoses Final diagnoses:  None    Rx / DC Orders ED Discharge Orders    None       Geoffery Lyons, MD 06/01/19 [phone]

## 2019-05-31 NOTE — Consult Note (Addendum)
Cardiology Consultation:   Patient ID: Rachel Rivers MRN: [id number]; DOB: Jun 03, 1965  Admit date: 05/31/2019 Date of Consult: 05/31/2019  Primary Care Provider: Selinda Flavin, MD Primary Cardiologist: Dr. Weber Cooks at [facility] Primary Electrophysiologist:  Dr. Purvis Sheffield   Patient Profile:   Rachel Rivers is a 54 y.o. female with a hx of CAD s/p multiple PCIs and 4v CABG 11/2015 (LIMA-LAD, SVG- RCA, SVG-OM1, SVG-OM2), prior gastric bypass, diabetes, VT on amiodarone with [employer] ICD in place and HTN who is being seen today for the evaluation of multiple ICD shocks.  History of Present Illness:   Rachel Rivers states that she has been in her usual state of health until this evening when she was cleaning the floor at her home when she was shocked by her ICD. She received 5 shocks from her ICD in total. She has not received any further shocks since calling 911. She endorses some mild chest discomfort at this time that she attributes to being shocked. She was not having any chest pain prior to her shocks. She walks at least 1 mile each day and has not had any chest pain or shortness of breath with exertion. She has not had an ICD shock in over a year and has been compliant with her home amiodarone and metoprolol. After getting shocked, she took an additional 200mg  of amiodarone and 100mg  of metoprolol while waiting for EMS. She states that her pulse typically runs around 100bpm at rest at home. She denies any shortness of breath, palpitations, lightheadedness, dizziness, lower leg swelling, orthopnea or PND. She does note that she has some GI upset right now which she attributes to her lactose intolerance. She believes her cardiac issues tend to act up when she has GI issues.    Past Medical History:  Diagnosis Date  . GERD (gastroesophageal reflux disease)   . 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 Hypertension   . Iron deficiency anemia   .  Restless leg syndrome   . Tachycardia   . Type 2 diabetes mellitus (HCC)     No past surgical history on file.   Home Medications:  Prior to Admission medications   Medication Sig Start Date End Date Taking? Authorizing Provider  aspirin EC 81 MG tablet Take 1 tablet (81 mg total) by mouth daily. 10/05/16   04/27/2017, MD  atorvastatin (LIPITOR) 40 MG tablet Take 40 mg by mouth daily.    [provider]  Calcium Carbonate-Vitamin D (CALCIUM-D PO) Take by mouth.    [provider]  Cholecalciferol (VITAMIN D PO) Take 100 Units by mouth daily.    [provider]  diphenhydrAMINE (BENADRYL) 50 MG tablet Take 50 mg by mouth at bedtime.    [provider]  docusate sodium (COLACE) 100 MG capsule Take 100 mg by mouth daily.    [provider]  glipiZIDE (GLUCOTROL) 5 MG tablet Take 5 mg by mouth 2 (two) times daily before a meal.     [provider]  HUMALOG KWIKPEN 100 UNIT/ML KiwkPen 8-20 Units See admin instructions.  09/19/16   [provider]  hydrochlorothiazide (HYDRODIURIL) 25 MG tablet Take 25 mg by mouth daily. 05/06/15   [provider]  LEVEMIR FLEXTOUCH 100 UNIT/ML Pen INJECT 68 UNITS AS DIRECTED TWICE DAILY. 09/21/16   [provider]  metoprolol (TOPROL-XL) 200 MG 24 hr tablet Take 200 mg by mouth daily.    [provider]  nitroGLYCERIN (NITROLINGUAL)  0.4 MG/SPRAY spray PLACE ONE SPRAY UNDER THE TONGUE EVERY 5 MINUTES AS NEEDED FOR CHEST PAIN 08/07/16   [provider]  NON FORMULARY CBD Oil, as directed    [provider]  Omega-3 Fatty Acids (FISH OIL) 1200 MG CPDR Take by mouth daily.     [provider]  ondansetron (ZOFRAN) 4 MG tablet Take 4 mg by mouth every 8 (eight) hours as needed. for nausea 07/25/16   [provider]  pantoprazole (PROTONIX) 40 MG tablet Take 40 mg by mouth 2 (two) times daily. 05/06/15   [provider]  PARoxetine  (PAXIL) 10 MG tablet Take 10 mg by mouth daily.    [provider]  polyethylene glycol (MIRALAX / GLYCOLAX) packet Take 17 g by mouth daily as needed.    [provider]  quinapril (ACCUPRIL) 5 MG tablet TAKE ONE TABLET BY MOUTH DAILY. 03/14/18   Herminio Commons, MD  sucralfate (CARAFATE) 1 g tablet Take by mouth 4 (four) times daily.  03/27/17   [provider]    Inpatient Medications: Scheduled Meds:  Continuous Infusions:  PRN Meds:   Allergies:    Allergies  Allergen Reactions  . Mushroom Extract Complex Anaphylaxis  . Iodinated Diagnostic Agents Hives  . Nsaids     Social History:   Social History   Socioeconomic History  . Marital status: Married    Spouse name: Not on file  . Number of children: Not on file  . Years of education: Not on file  . Highest education level: Not on file  Occupational History  . Not on file  Tobacco Use  . Smoking status: Former Smoker    Quit date: 03/04/1980    Years since quitting: 39.2  . Smokeless tobacco: Never Used  Substance and Sexual Activity  . Alcohol use: Not on file  . Drug use: Not on file  . Sexual activity: Not on file  Other Topics Concern  . Not on file  Social History Narrative  . Not on file   Social Determinants of Health   Financial Resource Strain:   . Difficulty of Paying Living Expenses:   Food Insecurity:   . Worried About Charity fundraiser in the Last Year:   . Arboriculturist in the Last Year:   Transportation Needs:   . Film/video editor (Medical):   Marland Kitchen Lack of Transportation (Non-Medical):   Physical Activity:   . Days of Exercise per Week:   . Minutes of Exercise per Session:   Stress:   . Feeling of Stress :   Social Connections:   . Frequency of Communication with Friends and Family:   . Frequency of Social Gatherings with Friends and Family:   . Attends Religious Services:   . Active Member of Clubs or Organizations:   . Attends Theatre manager Meetings:   Marland Kitchen Marital Status:   Intimate Partner Violence:   . Fear of Current or Ex-Partner:   . Emotionally Abused:   Marland Kitchen Physically Abused:   . Sexually Abused:     Family History:   Early CAD in father and brother  ROS:  Please see the history of present illness.  All other ROS reviewed and negative.     Physical Exam/Data:   Vitals:   05/31/19 2310 05/31/19 2311  BP: 113/66   Pulse: (!) 110   Resp: (!) 22   Temp: 99.4 F (37.4 C)   TempSrc: Oral   SpO2:  95%   Weight:  112.5 kg  Height:  5\' 8"  (1.727 m)    Intake/Output Summary (Last 24 hours) at 05/31/2019 2317 Last data filed at 05/31/2019 2259 Gross per 24 hour  Intake 250 ml  Output --  Net 250 ml   Last 3 Weights 05/31/2019 04/25/2017 10/05/2016  Weight (lbs) 248 lb 224 lb 225 lb 12.8 oz  Weight (kg) 112.492 kg 101.606 kg 102.422 kg     Body mass index is 37.71 kg/m.  General:  Well nourished, well developed, in no acute distress HEENT: normal Neck: no JVD Vascular: No carotid bruits; FA pulses 2+ bilaterally without bruits  Cardiac:  normal S1, S2; tachycardic, no murmurs, rubs or gallops Lungs:  clear to auscultation bilaterally, no wheezing, rhonchi or rales  Abd: soft, nontender, no hepatomegaly  Ext: no edema Musculoskeletal:  No deformities, BUE and BLE strength normal and equal Skin: warm and dry  Neuro:  CNs 2-12 intact, no focal abnormalities noted Psych:  Normal affect   EKG:  The EKG was personally reviewed and demonstrates:  Sinus tachycardia with LBBB Telemetry:  Telemetry was personally reviewed and demonstrates:  Sinus tachycardia with occasional PVCs  Relevant CV Studies: None in our system. Reportedly EF 45-50%  Laboratory Data:  High Sensitivity Troponin:  No results for input(s): TROPONINIHS in the last 720 hours.   ChemistryNo results for input(s): NA, K, CL, CO2, GLUCOSE, BUN, CREATININE, CALCIUM, GFRNONAA, GFRAA, ANIONGAP in the last 168 hours.  No results for  input(s): PROT, ALBUMIN, AST, ALT, ALKPHOS, BILITOT in the last 168 hours. HematologyNo results for input(s): WBC, RBC, HGB, HCT, MCV, MCH, MCHC, RDW, PLT in the last 168 hours. BNPNo results for input(s): BNP, PROBNP in the last 168 hours.  DDimer No results for input(s): DDIMER in the last 168 hours.   Radiology/Studies:  No results found. {  Assessment and Plan:    Rachel Rivers is a 54 y.o. female with a hx of CAD s/p multiple PCIs and 4v CABG 11/2015 (LIMA-LAD, SVG- RCA, SVG-OM1, SVG-OM2), prior gastric bypass, diabetes, VT on amiodarone with [employer] ICD in place and HTN who is being seen today for the evaluation of multiple ICD shocks. Interrogation showed inappropriate shocks for sinus tachycardia. Interestingly, her troponin has risen significantly on repeat check. She was asymptomatic until her ICD shocks and has been clinically doing well without any symptoms with exertion or at rest. She endorses being sore from shocks without frank chest pain. Will treat her as an NSTEMI and continue to trend her troponins. If her troponins continue to rise and echo shows wall motion abnormality or reduction in EF, may consider LHC on Monday. Will have EP see her to assist with device optimization to avoid additional ICD shocks.   - Monitor on tele  - Keep Mg >2 and K>4 - Obtain echo (ordered) - Continue home amiodarone 200mg   - Continue metoprolol 200mg  XL daily - Admit to medicine for monitoring and management of other comorbidities - Continue to trend troponins - Would give aspirin 325mg  and start on heparin drip ACS nomogram - Cardiology will continue to follow    For questions or updates, please contact [facility] Please consult [email or web address] for contact info under     Signed, Sunday, MD  05/31/2019 11:17 PM

## 2019-05-31 NOTE — ED Notes (Signed)
[employer] representative called to update on pt pacemaker interrogation. Rachel Rivers, representative informed RN that pt was shocked multiple time with inappropriate shocks due to the pt being 145 sinus tach. Dr. Judd Lien notified.

## 2019-06-01 ENCOUNTER — Inpatient Hospital Stay (HOSPITAL_COMMUNITY): Payer: Medicare HMO

## 2019-06-01 ENCOUNTER — Encounter (HOSPITAL_COMMUNITY): Payer: Self-pay | Admitting: Internal Medicine

## 2019-06-01 ENCOUNTER — Other Ambulatory Visit: Payer: Self-pay

## 2019-06-01 DIAGNOSIS — I472 Ventricular tachycardia: Secondary | ICD-10-CM | POA: Diagnosis present

## 2019-06-01 DIAGNOSIS — K30 Functional dyspepsia: Secondary | ICD-10-CM | POA: Diagnosis present

## 2019-06-01 DIAGNOSIS — N183 Chronic kidney disease, stage 3 unspecified: Secondary | ICD-10-CM | POA: Diagnosis not present

## 2019-06-01 DIAGNOSIS — I447 Left bundle-branch block, unspecified: Secondary | ICD-10-CM | POA: Diagnosis present

## 2019-06-01 DIAGNOSIS — E039 Hypothyroidism, unspecified: Secondary | ICD-10-CM | POA: Diagnosis present

## 2019-06-01 DIAGNOSIS — D649 Anemia, unspecified: Secondary | ICD-10-CM | POA: Diagnosis not present

## 2019-06-01 DIAGNOSIS — I340 Nonrheumatic mitral (valve) insufficiency: Secondary | ICD-10-CM | POA: Diagnosis not present

## 2019-06-01 DIAGNOSIS — T82198A Other mechanical complication of other cardiac electronic device, initial encounter: Secondary | ICD-10-CM

## 2019-06-01 DIAGNOSIS — I214 Non-ST elevation (NSTEMI) myocardial infarction: Secondary | ICD-10-CM | POA: Diagnosis present

## 2019-06-01 DIAGNOSIS — R7989 Other specified abnormal findings of blood chemistry: Secondary | ICD-10-CM | POA: Diagnosis not present

## 2019-06-01 DIAGNOSIS — G2581 Restless legs syndrome: Secondary | ICD-10-CM | POA: Diagnosis present

## 2019-06-01 DIAGNOSIS — R946 Abnormal results of thyroid function studies: Secondary | ICD-10-CM

## 2019-06-01 DIAGNOSIS — I471 Supraventricular tachycardia: Secondary | ICD-10-CM | POA: Diagnosis not present

## 2019-06-01 DIAGNOSIS — I255 Ischemic cardiomyopathy: Secondary | ICD-10-CM | POA: Diagnosis present

## 2019-06-01 DIAGNOSIS — I5022 Chronic systolic (congestive) heart failure: Secondary | ICD-10-CM

## 2019-06-01 DIAGNOSIS — I130 Hypertensive heart and chronic kidney disease with heart failure and stage 1 through stage 4 chronic kidney disease, or unspecified chronic kidney disease: Secondary | ICD-10-CM | POA: Diagnosis not present

## 2019-06-01 DIAGNOSIS — I361 Nonrheumatic tricuspid (valve) insufficiency: Secondary | ICD-10-CM | POA: Diagnosis not present

## 2019-06-01 DIAGNOSIS — N179 Acute kidney failure, unspecified: Secondary | ICD-10-CM

## 2019-06-01 DIAGNOSIS — R778 Other specified abnormalities of plasma proteins: Secondary | ICD-10-CM | POA: Diagnosis not present

## 2019-06-01 DIAGNOSIS — Y712 Prosthetic and other implants, materials and accessory cardiovascular devices associated with adverse incidents: Secondary | ICD-10-CM | POA: Diagnosis present

## 2019-06-01 DIAGNOSIS — T82855A Stenosis of coronary artery stent, initial encounter: Secondary | ICD-10-CM | POA: Diagnosis not present

## 2019-06-01 DIAGNOSIS — Z9884 Bariatric surgery status: Secondary | ICD-10-CM | POA: Diagnosis not present

## 2019-06-01 DIAGNOSIS — I2510 Atherosclerotic heart disease of native coronary artery without angina pectoris: Secondary | ICD-10-CM | POA: Diagnosis present

## 2019-06-01 DIAGNOSIS — E739 Lactose intolerance, unspecified: Secondary | ICD-10-CM | POA: Diagnosis present

## 2019-06-01 DIAGNOSIS — I69354 Hemiplegia and hemiparesis following cerebral infarction affecting left non-dominant side: Secondary | ICD-10-CM | POA: Diagnosis not present

## 2019-06-01 DIAGNOSIS — Z20822 Contact with and (suspected) exposure to covid-19: Secondary | ICD-10-CM | POA: Diagnosis not present

## 2019-06-01 DIAGNOSIS — I4891 Unspecified atrial fibrillation: Secondary | ICD-10-CM | POA: Diagnosis present

## 2019-06-01 DIAGNOSIS — I5043 Acute on chronic combined systolic (congestive) and diastolic (congestive) heart failure: Secondary | ICD-10-CM | POA: Diagnosis present

## 2019-06-01 DIAGNOSIS — N1832 Chronic kidney disease, stage 3b: Secondary | ICD-10-CM | POA: Diagnosis present

## 2019-06-01 DIAGNOSIS — T82198D Other mechanical complication of other cardiac electronic device, subsequent encounter: Secondary | ICD-10-CM

## 2019-06-01 DIAGNOSIS — Z95810 Presence of automatic (implantable) cardiac defibrillator: Secondary | ICD-10-CM | POA: Diagnosis not present

## 2019-06-01 DIAGNOSIS — E1122 Type 2 diabetes mellitus with diabetic chronic kidney disease: Secondary | ICD-10-CM | POA: Diagnosis not present

## 2019-06-01 DIAGNOSIS — R079 Chest pain, unspecified: Secondary | ICD-10-CM | POA: Diagnosis not present

## 2019-06-01 DIAGNOSIS — K219 Gastro-esophageal reflux disease without esophagitis: Secondary | ICD-10-CM | POA: Diagnosis present

## 2019-06-01 LAB — APTT
aPTT: 43 seconds — ABNORMAL HIGH (ref 24–36)
aPTT: 49 seconds — ABNORMAL HIGH (ref 24–36)

## 2019-06-01 LAB — MAGNESIUM
Magnesium: 1.4 mg/dL — ABNORMAL LOW (ref 1.7–2.4)
Magnesium: 2.1 mg/dL (ref 1.7–2.4)

## 2019-06-01 LAB — BASIC METABOLIC PANEL
Anion gap: 14 (ref 5–15)
Anion gap: 13 (ref 5–15)
BUN: 25 mg/dL — ABNORMAL HIGH (ref 6–20)
BUN: 28 mg/dL — ABNORMAL HIGH (ref 6–20)
CO2: 21 mmol/L — ABNORMAL LOW (ref 22–32)
CO2: 26 mmol/L (ref 22–32)
Calcium: 8.2 mg/dL — ABNORMAL LOW (ref 8.9–10.3)
Calcium: 8.8 mg/dL — ABNORMAL LOW (ref 8.9–10.3)
Chloride: 103 mmol/L (ref 98–111)
Chloride: 99 mmol/L (ref 98–111)
Creatinine, Ser: 1.51 mg/dL — ABNORMAL HIGH (ref 0.44–1.00)
Creatinine, Ser: 1.49 mg/dL — ABNORMAL HIGH (ref 0.44–1.00)
GFR calc Af Amer: 45 mL/min — ABNORMAL LOW (ref >60)
GFR calc Af Amer: 45 mL/min — ABNORMAL LOW (ref >60)
GFR calc non Af Amer: 39 mL/min — ABNORMAL LOW (ref >60)
GFR calc non Af Amer: 39 mL/min — ABNORMAL LOW (ref >60)
Glucose, Bld: 148 mg/dL — ABNORMAL HIGH (ref 70–99)
Glucose, Bld: 142 mg/dL — ABNORMAL HIGH (ref 70–99)
Potassium: 4 mmol/L (ref 3.5–5.1)
Potassium: 4 mmol/L (ref 3.5–5.1)
Sodium: 138 mmol/L (ref 135–145)
Sodium: 138 mmol/L (ref 135–145)

## 2019-06-01 LAB — GLUCOSE, CAPILLARY
Glucose-Capillary: 174 mg/dL — ABNORMAL HIGH (ref 70–99)
Glucose-Capillary: 223 mg/dL — ABNORMAL HIGH (ref 70–99)
Glucose-Capillary: 207 mg/dL — ABNORMAL HIGH (ref 70–99)
Glucose-Capillary: 148 mg/dL — ABNORMAL HIGH (ref 70–99)

## 2019-06-01 LAB — TROPONIN I (HIGH SENSITIVITY)
Troponin I (High Sensitivity): 402 ng/L (ref <18)
Troponin I (High Sensitivity): 3603 ng/L (ref <18)
Troponin I (High Sensitivity): 6277 ng/L (ref <18)
Troponin I (High Sensitivity): 4309 ng/L (ref <18)
Troponin I (High Sensitivity): 4271 ng/L (ref <18)

## 2019-06-01 LAB — HEMOGLOBIN A1C
Hgb A1c MFr Bld: 8.4 % — ABNORMAL HIGH (ref 4.8–5.6)
Mean Plasma Glucose: 194.38 mg/dL

## 2019-06-01 LAB — ECHOCARDIOGRAM COMPLETE
Height: 68 in
Weight: 3987.2 oz

## 2019-06-01 LAB — TSH: TSH: 7.791 u[IU]/mL — ABNORMAL HIGH (ref 0.350–4.500)

## 2019-06-01 LAB — T4, FREE: Free T4: 1.23 ng/dL — ABNORMAL HIGH (ref 0.61–1.12)

## 2019-06-01 LAB — HIV ANTIBODY (ROUTINE TESTING W REFLEX): HIV Screen 4th Generation wRfx: NONREACTIVE

## 2019-06-01 LAB — HEPARIN LEVEL (UNFRACTIONATED): Heparin Unfractionated: >2.2 IU/mL — ABNORMAL HIGH (ref 0.30–0.70)

## 2019-06-01 LAB — SARS CORONAVIRUS 2 (TAT 6-24 HRS): SARS Coronavirus 2: NEGATIVE

## 2019-06-01 MED ORDER — HEPARIN BOLUS VIA INFUSION
4000.0000 [IU] | Freq: Once | INTRAVENOUS | Status: AC
  Administered 2019-06-01: 4000 [IU] via INTRAVENOUS
  Filled 2019-06-01: qty 4000

## 2019-06-01 MED ORDER — NITROGLYCERIN IN D5W 200-5 MCG/ML-% IV SOLN
0.0000 ug/min | INTRAVENOUS | Status: DC
  Administered 2019-06-01: 5 ug/min via INTRAVENOUS

## 2019-06-01 MED ORDER — SUCRALFATE 1 GM/10ML PO SUSP
1.0000 g | Freq: Four times a day (QID) | ORAL | Status: DC
  Administered 2019-06-01 – 2019-06-04 (×14): 1 g via ORAL
  Filled 2019-06-01 (×13): qty 10

## 2019-06-01 MED ORDER — SODIUM CHLORIDE 0.9% FLUSH: 3.0000 mL | Freq: Two times a day (BID) | INTRAVENOUS | Status: DC

## 2019-06-01 MED ORDER — SODIUM CHLORIDE 0.9 % IV SOLN: 250.0000 mL | INTRAVENOUS | Status: DC | PRN

## 2019-06-01 MED ORDER — MAGNESIUM SULFATE 2 GM/50ML IV SOLN
2.0000 g | INTRAVENOUS | Status: AC
  Administered 2019-06-01 (×2): 2 g via INTRAVENOUS
  Filled 2019-06-01 (×2): qty 50

## 2019-06-01 MED ORDER — ASPIRIN EC 81 MG PO TBEC
81.0000 mg | DELAYED_RELEASE_TABLET | Freq: Every day | ORAL | Status: DC
  Administered 2019-06-03 – 2019-06-04 (×2): 81 mg via ORAL
  Filled 2019-06-01 (×3): qty 1

## 2019-06-01 MED ORDER — POLYETHYLENE GLYCOL 3350 17 G PO PACK
17.0000 g | PACK | Freq: Every day | ORAL | Status: DC | PRN
  Filled 2019-06-01: qty 1

## 2019-06-01 MED ORDER — ACETAMINOPHEN 325 MG PO TABS
650.0000 mg | ORAL_TABLET | Freq: Four times a day (QID) | ORAL | Status: DC | PRN
  Administered 2019-06-01 – 2019-06-02 (×3): 650 mg via ORAL
  Filled 2019-06-01 (×3): qty 2

## 2019-06-01 MED ORDER — ONDANSETRON HCL 4 MG/2ML IJ SOLN
4.0000 mg | Freq: Once | INTRAMUSCULAR | Status: AC
  Administered 2019-06-01: 4 mg via INTRAVENOUS
  Filled 2019-06-01: qty 2

## 2019-06-01 MED ORDER — HEPARIN (PORCINE) 25000 UT/250ML-% IV SOLN
1700.0000 [IU]/h | INTRAVENOUS | Status: DC
  Administered 2019-06-01: 1300 [IU]/h via INTRAVENOUS
  Administered 2019-06-01: 1500 [IU]/h via INTRAVENOUS
  Administered 2019-06-02: 1700 [IU]/h via INTRAVENOUS
  Filled 2019-06-01 (×3): qty 250

## 2019-06-01 MED ORDER — ATORVASTATIN CALCIUM 80 MG PO TABS
80.0000 mg | ORAL_TABLET | Freq: Every day | ORAL | Status: DC
  Administered 2019-06-01 – 2019-06-03 (×3): 80 mg via ORAL
  Filled 2019-06-01 (×3): qty 1

## 2019-06-01 MED ORDER — AMIODARONE HCL 200 MG PO TABS
200.0000 mg | ORAL_TABLET | Freq: Every day | ORAL | Status: DC
  Administered 2019-06-01 – 2019-06-04 (×4): 200 mg via ORAL
  Filled 2019-06-01 (×4): qty 1

## 2019-06-01 MED ORDER — INSULIN ASPART 100 UNIT/ML ~~LOC~~ SOLN
0.0000 [IU] | Freq: Three times a day (TID) | SUBCUTANEOUS | Status: DC
  Administered 2019-06-01: 2 [IU] via SUBCUTANEOUS
  Administered 2019-06-01 (×2): 3 [IU] via SUBCUTANEOUS
  Administered 2019-06-02 (×3): 7 [IU] via SUBCUTANEOUS
  Administered 2019-06-03: 5 [IU] via SUBCUTANEOUS
  Administered 2019-06-03: 8 [IU] via SUBCUTANEOUS
  Administered 2019-06-03: 7 [IU] via SUBCUTANEOUS
  Administered 2019-06-04: 11 [IU] via SUBCUTANEOUS
  Administered 2019-06-04: 9 [IU] via SUBCUTANEOUS

## 2019-06-01 MED ORDER — MORPHINE SULFATE (PF) 2 MG/ML IV SOLN
1.0000 mg | Freq: Once | INTRAVENOUS | Status: AC
  Administered 2019-06-01: 1 mg via INTRAVENOUS

## 2019-06-01 MED ORDER — NITROGLYCERIN IN D5W 200-5 MCG/ML-% IV SOLN
INTRAVENOUS | Status: AC
  Filled 2019-06-01: qty 250

## 2019-06-01 MED ORDER — NITROGLYCERIN 0.4 MG SL SUBL
0.4000 mg | SUBLINGUAL_TABLET | SUBLINGUAL | Status: DC | PRN
  Administered 2019-06-01 (×2): 0.4 mg via SUBLINGUAL

## 2019-06-01 MED ORDER — ASPIRIN 81 MG PO CHEW: 162.0000 mg | CHEWABLE_TABLET | Freq: Once | ORAL | Status: DC

## 2019-06-01 MED ORDER — SODIUM CHLORIDE 0.9% FLUSH: 3.0000 mL | INTRAVENOUS | Status: DC | PRN

## 2019-06-01 MED ORDER — SODIUM CHLORIDE 0.9 % IV SOLN: INTRAVENOUS | Status: DC

## 2019-06-01 MED ORDER — FUROSEMIDE 10 MG/ML IJ SOLN
INTRAMUSCULAR | Status: AC
  Filled 2019-06-01: qty 4

## 2019-06-01 MED ORDER — ASPIRIN EC 81 MG PO TBEC: 81.0000 mg | DELAYED_RELEASE_TABLET | Freq: Every day | ORAL | Status: DC

## 2019-06-01 MED ORDER — LIDOCAINE VISCOUS HCL 2 % MT SOLN
15.0000 mL | Freq: Once | OROMUCOSAL | Status: AC
  Administered 2019-06-01: 15 mL via ORAL
  Filled 2019-06-01: qty 15

## 2019-06-01 MED ORDER — PREDNISONE 20 MG PO TABS
50.0000 mg | ORAL_TABLET | Freq: Four times a day (QID) | ORAL | Status: AC
  Administered 2019-06-01 – 2019-06-02 (×3): 50 mg via ORAL
  Filled 2019-06-01 (×3): qty 1

## 2019-06-01 MED ORDER — RISAQUAD PO CAPS
1.0000 | ORAL_CAPSULE | Freq: Every day | ORAL | Status: DC
  Administered 2019-06-01 – 2019-06-04 (×3): 1 via ORAL
  Filled 2019-06-01 (×3): qty 1

## 2019-06-01 MED ORDER — DIPHENHYDRAMINE HCL 25 MG PO CAPS: 50.0000 mg | ORAL_CAPSULE | ORAL | Status: AC

## 2019-06-01 MED ORDER — NITROGLYCERIN 0.4 MG SL SUBL
SUBLINGUAL_TABLET | SUBLINGUAL | Status: AC
  Administered 2019-06-01: 0.4 mg
  Filled 2019-06-01: qty 1

## 2019-06-01 MED ORDER — ONDANSETRON HCL 4 MG/2ML IJ SOLN
4.0000 mg | Freq: Four times a day (QID) | INTRAMUSCULAR | Status: DC | PRN
  Administered 2019-06-01 – 2019-06-02 (×2): 4 mg via INTRAVENOUS
  Filled 2019-06-01 (×2): qty 2

## 2019-06-01 MED ORDER — DIPHENHYDRAMINE HCL 50 MG/ML IJ SOLN
50.0000 mg | INTRAMUSCULAR | Status: AC
  Administered 2019-06-02: 50 mg via INTRAVENOUS
  Filled 2019-06-01: qty 1

## 2019-06-01 MED ORDER — PAROXETINE HCL 10 MG PO TABS
10.0000 mg | ORAL_TABLET | Freq: Every day | ORAL | Status: DC
  Administered 2019-06-01 – 2019-06-04 (×4): 10 mg via ORAL
  Filled 2019-06-01 (×4): qty 1

## 2019-06-01 MED ORDER — FUROSEMIDE 10 MG/ML IJ SOLN
20.0000 mg | Freq: Two times a day (BID) | INTRAMUSCULAR | Status: DC
  Administered 2019-06-01: 20 mg via INTRAVENOUS
  Filled 2019-06-01 (×2): qty 2

## 2019-06-01 MED ORDER — SUCRALFATE 1 G PO TABS
1.0000 g | ORAL_TABLET | Freq: Four times a day (QID) | ORAL | Status: DC
  Filled 2019-06-01: qty 1

## 2019-06-01 MED ORDER — PANTOPRAZOLE SODIUM 40 MG PO TBEC
40.0000 mg | DELAYED_RELEASE_TABLET | Freq: Two times a day (BID) | ORAL | Status: DC
  Administered 2019-06-01 – 2019-06-04 (×7): 40 mg via ORAL
  Filled 2019-06-01 (×7): qty 1

## 2019-06-01 MED ORDER — ASPIRIN 81 MG PO CHEW
324.0000 mg | CHEWABLE_TABLET | Freq: Once | ORAL | Status: AC
  Administered 2019-06-01: 324 mg via ORAL
  Filled 2019-06-01: qty 4

## 2019-06-01 MED ORDER — ASPIRIN 81 MG PO CHEW
81.0000 mg | CHEWABLE_TABLET | ORAL | Status: AC
  Administered 2019-06-02: 08:00:00 81 mg via ORAL
  Filled 2019-06-01: qty 1

## 2019-06-01 MED ORDER — FUROSEMIDE 10 MG/ML IJ SOLN
20.0000 mg | Freq: Once | INTRAMUSCULAR | Status: AC
  Administered 2019-06-01: 40 mg via INTRAVENOUS

## 2019-06-01 MED ORDER — ALUM & MAG HYDROXIDE-SIMETH 200-200-20 MG/5ML PO SUSP
30.0000 mL | Freq: Once | ORAL | Status: AC
  Administered 2019-06-01: 30 mL via ORAL
  Filled 2019-06-01: qty 30

## 2019-06-01 MED ORDER — ADULT MULTIVITAMIN W/MINERALS CH
1.0000 | ORAL_TABLET | Freq: Every day | ORAL | Status: DC
  Administered 2019-06-01 – 2019-06-04 (×3): 1 via ORAL
  Filled 2019-06-01 (×3): qty 1

## 2019-06-01 MED ORDER — METOPROLOL SUCCINATE ER 100 MG PO TB24
200.0000 mg | ORAL_TABLET | Freq: Every day | ORAL | Status: DC
  Administered 2019-06-01 – 2019-06-04 (×4): 200 mg via ORAL
  Filled 2019-06-01 (×5): qty 2

## 2019-06-01 MED ORDER — INSULIN ASPART 100 UNIT/ML ~~LOC~~ SOLN
0.0000 [IU] | Freq: Every day | SUBCUTANEOUS | Status: DC
  Administered 2019-06-02: 4 [IU] via SUBCUTANEOUS

## 2019-06-01 MED ORDER — INSULIN GLARGINE 100 UNIT/ML ~~LOC~~ SOLN
60.0000 [IU] | Freq: Every day | SUBCUTANEOUS | Status: DC
  Administered 2019-06-01: 60 [IU] via SUBCUTANEOUS
  Filled 2019-06-01 (×3): qty 0.6

## 2019-06-01 MED ORDER — LEVOTHYROXINE SODIUM 100 MCG PO TABS
100.0000 ug | ORAL_TABLET | Freq: Every day | ORAL | Status: DC
  Administered 2019-06-01 – 2019-06-04 (×4): 100 ug via ORAL
  Filled 2019-06-01 (×4): qty 1

## 2019-06-01 MED ORDER — MORPHINE SULFATE (PF) 2 MG/ML IV SOLN
INTRAVENOUS | Status: AC
  Filled 2019-06-01: qty 1

## 2019-06-01 NOTE — Progress Notes (Signed)
ANTICOAGULATION CONSULT NOTE - Follow Up Consult  Pharmacy Consult for heparin Indication: chest pain/ACS  Allergies  Allergen Reactions  . Mushroom Extract Complex Anaphylaxis  . 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 Iodinated Diagnostic Agents Hives  . Nsaids   . Lactose Diarrhea    Patient Measurements: Height: 5\' 8"  (172.7 cm) Weight: 113 kg (249 lb 3.2 oz) IBW/kg (Calculated) : 63.9 Heparin Dosing Weight: 90kg  Vital Signs: Temp: 98.2 F (36.8 C) (04/04 0809) Temp Source: Oral (04/04 0809) BP: 133/80 (04/04 0809) Pulse Rate: 106 (04/04 0809)  Labs: Recent Labs    05/31/19 2324 05/31/19 2324 06/01/19 0156 06/01/19 0537 06/01/19 0912  HGB 10.4*  --   --   --   --   HCT 35.3*  --   --   --   --   PLT 298  --   --   --   --   APTT  --   --   --   --  43*  HEPARINUNFRC  --   --   --   --  >2.20*  CREATININE 1.51*  --   --  1.49*  --   TROPONINIHS 402*   < > 3,603* 6,277* 4,309*   < > = values in this interval not displayed.    Estimated Creatinine Clearance: 56.2 mL/min (A) (by C-G formula based on SCr of 1.49 mg/dL (H)).   Medical History: Past Medical History:  Diagnosis Date  . GERD (gastroesophageal reflux disease)   . 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 Hypertension   . Iron deficiency anemia   . Restless leg syndrome   . Tachycardia   . Type 2 diabetes mellitus (HCC)     Assessment: 54yo female c/o CP and inappropriate defib firing, troponin elevated and rising 54yo), to begin heparin until able to get LHC. She was on Eliquis PTA with the last dose taken on 4/3 @ 2000.   Initial heparin level is falsely elevated due to recent Eliquis use. Her aPTT is low at 43 seconds.  Goal of Therapy:  Heparin level 0.3-0.7 units/ml aPTT 66-102 seconds Monitor  platelets by anticoagulation protocol: Yes   Plan:  Increase heparin drip to 1500 units/hr Check aPTT in 6 hours Daily heparin level, aPTT (until heparin level correlates), and CBC Monitor for bleeding F/u after LHC  2001, PharmD, [facility] PGY2 Cardiology Pharmacy Resident Phone [phone] 06/01/2019       12:56 PM  Please check [email or web address] for unit-specific pharmacist phone numbers

## 2019-06-01 NOTE — Progress Notes (Signed)
Patient's device interrogation which was performed in the emergency room was reviewed.  Patient appears to have had multiple ICD shocks that were inappropriate and due to atrial arrhythmias.  We Rachel Rivers make changes to her third zone, increasing it from 145 to 155 bpm.  This Rachel Rivers hopefully prevent further inappropriate ICD shocks.  She does continue to have an elevated troponin despite inappropriate shocks.  We Rachel Rivers plan for left heart catheterization to determine if she has any coronary occlusions that would explain her non-STEMI.  Rachel Rivers Elberta Fortis, MD 06/01/2019 11:05 AM

## 2019-06-01 NOTE — ED Notes (Signed)
Pt assisted to the bedside commode. Pt assisted back into bed, and resting comfortably at this time.

## 2019-06-01 NOTE — Progress Notes (Signed)
  Echocardiogram 2D Echocardiogram has been performed.  Gerda Diss 06/01/2019, 11:34 AM

## 2019-06-01 NOTE — Progress Notes (Signed)
ANTICOAGULATION CONSULT NOTE - Follow Up Consult  Pharmacy Consult for heparin Indication: chest pain/ACS  Allergies  Allergen Reactions  . Mushroom Extract Complex Anaphylaxis  . 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 Iodinated Diagnostic Agents Hives  . Nsaids   . Lactose Diarrhea    Patient Measurements: Height: 5\' 8"  (172.7 cm) Weight: 113 kg (249 lb 3.2 oz) IBW/kg (Calculated) : 63.9 Heparin Dosing Weight: 90kg  Vital Signs: Temp: 98.4 F (36.9 C) (04/04 1940) Temp Source: Axillary (04/04 1940) BP: 138/74 (04/04 1940) Pulse Rate: 99 (04/04 1940)  Labs: Recent Labs    05/31/19 2324 06/01/19 0156 06/01/19 0537 06/01/19 0912 06/01/19 1354 06/01/19 1921  HGB 10.4*  --   --   --   --   --   HCT 35.3*  --   --   --   --   --   PLT 298  --   --   --   --   --   APTT  --   --   --  43*  --  49*  HEPARINUNFRC  --   --   --  >2.20*  --   --   CREATININE 1.51*  --  1.49*  --   --   --   TROPONINIHS 402*   < > 6,277* 4,309* 4,271*  --    < > = values in this interval not displayed.    Estimated Creatinine Clearance: 56.2 mL/min (A) (by C-G formula based on SCr of 1.49 mg/dL (H)).   Medical History: Past Medical History:  Diagnosis Date  . GERD (gastroesophageal reflux disease)   . 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 Hypertension   . Iron deficiency anemia   . Restless leg syndrome   . Tachycardia   . Type 2 diabetes mellitus (HCC)     Assessment: 54yo female c/o CP and inappropriate defib firing, troponin elevated and rising 54yo), to begin heparin until able to get LHC. She was on Eliquis PTA with the last dose taken on 4/3 @ 2000.   Initial heparin level is falsely elevated due to recent Eliquis use. Repeat aPTT remains subtherapeutic but trending up slightly to 49  seconds.  Goal of Therapy:  Heparin level 0.3-0.7 units/ml aPTT 66-102 seconds Monitor platelets by anticoagulation protocol: Yes   Plan:  Increase heparin drip to 1700 units/hr Repeat aPTT and heparin level with am labs   2001, PharmD, BCPS Clinical Pharmacist [phone] Please check AMION for all [facility] numbers 06/01/2019

## 2019-06-01 NOTE — Progress Notes (Signed)
Patient ambulated to and from bathroom. On return to bed reported chest pressure 7/10, center of chest, no radiation and nausea.  EKG done and patient given sublingual NTG.  Oxygen saturation noted to be 65 on RA, placed on 6L [place], sats increased to 85%, placed on NRBM and sats increased to 90%.  Dr. Elberta Fortis notified and here to see patient.  Pain continued at 7/10 after second NTG SL given.  Patient given lasix 40mg  IV, zofran IV and Morphine 2mg  IV with decreased nausea and pain decreased to 2-3 out of 10.  Purwick placed.  Patient husband at bedside.

## 2019-06-01 NOTE — Progress Notes (Addendum)
PROGRESS NOTE    Rachel Rivers  QTM:[id number] DOB: 1965/11/25 DOA: 05/31/2019 PCP: Selinda Flavin, MD   Brief Narrative:  Per admitting MD: Rachel Rivers is a 54 y.o. female with medical history significant of CAD status post multiple PCI's and CABG, VT on amiodarone with ICD in place, hypertension, type 2 diabetes, CVA being brought to the ED for evaluation of defibrillator firing.  Patient states she was bending down scrubbing the floor.  Upon standing up straight she felt lightheaded and her heart started racing.  She then went to the bathroom and while sitting down on toilet seat, received a shock from her ICD.  She then walked to her bedroom and soon after sitting down received another 4 shocks from the ICD.  She was not having any chest pain previously but after receiving shocks she started experiencing substernal nonradiating pressure-like chest pain associated with diaphoresis and nausea.  States she had already taken her metoprolol and amiodarone yesterday morning.  She then took additional doses of each medication prior to EMS arrival.  In addition, she took 2 baby aspirin's and 3 sublingual nitroglycerin tablets.  Chest pain was only partially relieved after the 3 doses of nitroglycerin.  At present, states she is not having chest pressure but instead having slight muscle soreness in her chest.   Assessment & Plan:   Principal Problem:   NSTEMI (non-ST elevated myocardial infarction) (HCC) Active Problems:   Inappropriate shocks from ICD (implantable cardioverter-defibrillator)   Anemia   AKI (acute kidney injury) (HCC)   Abnormal results of thyroid function studies   Inappropriate shocks from ICD/possible NSTEMI:   Cardiac monitoring. Per cards: Aspirin, heparin drip per ACS protocol.  Keep potassium above 2 and magnesium above 4.  Continue home amiodarone and metoprolol. EP consult-interrogate ICD revealed inappropriate shocks, LHC Monday  Hypomagnesemia:  2 g every 4 hours  x2 Check in the morning  Normocytic anemia: Hemoglobin 10.4, baseline in the 8-9 range in 2019 per care everywhere.  Continue to monitor.  AKI: BUN 25, creatinine 1.5. Creatinine was 1.0 on labs done in January 2021 per care everywhere. Hold home diuretics and ACE inhibitor.  Repeat BMP in a.m.  Monitor urine output.  Hypothyroidism: TSH elevated at 7.7, free T4 borderline elevated at 1.2.  Continue home Synthroid and repeat thyroid function studies in 4 to 6 weeks.  Uncontrolled insulin-dependent type 2 diabetes: A1c 9.6 on 03/06/2019.  repeat A1c 8.4, continue home long-acting insulin, sliding scale insulin sensitive ACHS, and CBG checks.  Hypertension: Currently normotensive  DVT prophylaxis: On:hep gtt  Code Status: full    Code Status Orders  (From admission, onward)         Start     Ordered   06/01/19 0407  Full code  Continuous     06/01/19 0409        Code Status History    This patient has a current code status but no historical code status.   Advance Care Planning Activity     Family Communication: Discussed in detail with patient today Disposition Plan:   Patient unsafe for discharge.  Plan for left heart cath tomorrow Consults called: None Admission status: Inpatient   Consultants:   Cardiology  Procedures:  DG Chest Port 1 View  Result Date: 05/31/2019 CLINICAL DATA:  Chest pain, STEMI, ventricular tachycardia, defibrillator discharge EXAM: PORTABLE CHEST 1 VIEW COMPARISON:  Portable exam 2344 hours without prior exams available for comparison FINDINGS: LEFT subclavian AICD with leads projecting over RIGHT atrium  and RIGHT ventricle. Normal heart size, mediastinal contours, and pulmonary vascularity. Lungs clear. No infiltrate, pleural effusion, or pneumothorax. Osseous structures unremarkable. IMPRESSION: No acute abnormalities. Electronically Signed   By: Ulyses Southward M.D.   On: 05/31/2019 23:52   ECHOCARDIOGRAM COMPLETE  Result Date: 06/01/2019     ECHOCARDIOGRAM REPORT   Patient Name:   Rachel Rivers Date of Exam: 06/01/2019 Medical Rec #:  [id number]       Height:       68.0 in Accession #:    [id number]      Weight:       249.2 lb Date of Birth:  Jul 10, 1965       BSA:          2.244 m Patient Age:    54 years        BP:           133/87 mmHg Patient Gender: F               HR:           90 bpm. Exam Location:  Inpatient Procedure: 2D Echo, Color Doppler and Cardiac Doppler Indications:    Ventricular tachycardia I47.2  History:        Patient has no prior history of Echocardiogram examinations.                 CAD, Prior CABG and Defibrillator; Risk Factors:Diabetes and                 Hypertension.  Sonographer:    Ross Ludwig RDCS (AE) Referring Phys: [id number] [facility]  Sonographer Comments: Suboptimal subcostal window and patient is morbidly obese. Image acquisition challenging due to patient body habitus. IMPRESSIONS  1. Akinesis of the inferolateral wall with overall mild to moderate LV dysfunction; mild LVH; grade 2 diastolic dysfunction; mild MR; mild LAE; mild TR.  2. Left ventricular ejection fraction, by estimation, is 40 to 45%. The left ventricle has mild to moderately decreased function. The left ventricle demonstrates regional wall motion abnormalities (see scoring diagram/findings for description). There is  mild left ventricular hypertrophy. Left ventricular diastolic parameters are consistent with Grade II diastolic dysfunction (pseudonormalization).  3. Right ventricular systolic function is normal. The right ventricular size is normal.  4. Left atrial size was mildly dilated.  5. The mitral valve is normal in structure. Mild mitral valve regurgitation. No evidence of mitral stenosis.  6. The aortic valve is tricuspid. Aortic valve regurgitation is not visualized. No aortic stenosis is present. FINDINGS  Left Ventricle: Left ventricular ejection fraction, by estimation, is 40 to 45%. The left ventricle has mild to moderately  decreased function. The left ventricle demonstrates regional wall motion abnormalities. The left ventricular internal cavity size was normal in size. There is mild left ventricular hypertrophy. Left ventricular diastolic parameters are consistent with Grade II diastolic dysfunction (pseudonormalization). Right Ventricle: The right ventricular size is normal.Right ventricular systolic function is normal. Left Atrium: Left atrial size was mildly dilated. Right Atrium: Right atrial size was normal in size. Pericardium: There is no evidence of pericardial effusion. Mitral Valve: The mitral valve is normal in structure. Normal mobility of the mitral valve leaflets. Mild mitral annular calcification. Mild mitral valve regurgitation. No evidence of mitral valve stenosis. Tricuspid Valve: The tricuspid valve is normal in structure. Tricuspid valve regurgitation is mild . No evidence of tricuspid stenosis. Aortic Valve: The aortic valve is tricuspid. Aortic valve regurgitation is not visualized. No aortic stenosis is present.  Aortic valve mean gradient measures 4.0 mmHg. Aortic valve peak gradient measures 5.7 mmHg. Aortic valve area, by VTI measures 2.65 cm. Pulmonic Valve: The pulmonic valve was normal in structure. Pulmonic valve regurgitation is not visualized. No evidence of pulmonic stenosis. Aorta: The aortic root is normal in size and structure. Venous: The inferior vena cava was not well visualized. IAS/Shunts: No atrial level shunt detected by color flow Doppler. Additional Comments: Akinesis of the inferolateral wall with overall mild to moderate LV dysfunction; mild LVH; grade 2 diastolic dysfunction; mild MR; mild LAE; mild TR. A pacer wire is visualized.  LEFT VENTRICLE PLAX 2D LVIDd:         4.60 cm      Diastology LVIDs:         3.60 cm      LV e' lateral: 11.50 cm/s LV PW:         1.20 cm      LV e' medial:  6.64 cm/s LV IVS:        1.10 cm LVOT diam:     2.20 cm LV SV:         60 LV SV Index:   27 LVOT  Area:     3.80 cm  LV Volumes (MOD) LV vol d, MOD A2C: 91.5 ml LV vol d, MOD A4C: 127.0 ml LV vol s, MOD A2C: 53.9 ml LV vol s, MOD A4C: 80.8 ml LV SV MOD A2C:     37.6 ml LV SV MOD A4C:     127.0 ml LV SV MOD BP:      41.8 ml RIGHT VENTRICLE RV Basal diam:  2.50 cm RV S prime:     5.98 cm/s TAPSE (M-mode): 1.3 cm LEFT ATRIUM             Index       RIGHT ATRIUM           Index LA diam:        4.30 cm 1.92 cm/m  RA Area:     13.20 cm LA Vol (A2C):   76.7 ml 34.18 ml/m RA Volume:   28.30 ml  12.61 ml/m LA Vol (A4C):   87.1 ml 38.81 ml/m LA Biplane Vol: 86.1 ml 38.37 ml/m  AORTIC VALVE AV Area (Vmax):    2.32 cm AV Area (Vmean):   2.36 cm AV Area (VTI):     2.65 cm AV Vmax:           119.00 cm/s AV Vmean:          90.200 cm/s AV VTI:            0.228 m AV Peak Grad:      5.7 mmHg AV Mean Grad:      4.0 mmHg LVOT Vmax:         72.60 cm/s LVOT Vmean:        55.900 cm/s LVOT VTI:          0.159 m LVOT/AV VTI ratio: 0.70  AORTA Ao Root diam: 3.10 cm MR Peak grad:    87.6 mmHg   TRICUSPID VALVE MR Mean grad:    60.0 mmHg   TR Peak grad:   34.8 mmHg MR Vmax:         468.00 cm/s TR Vmax:        295.00 cm/s MR Vmean:        366.0 cm/s MR PISA:         3.08 cm  SHUNTS MR PISA Eff ROA: 20 mm      Systemic VTI:  0.16 m MR PISA Radius:  0.70 cm     Systemic Diam: 2.20 cm Kirk Ruths MD Electronically signed by Kirk Ruths MD Signature Date/Time: 06/01/2019/11:53:04 AM    Final       Subjective: No chest pain currently resting in bed comfortably Overnight admit  Objective: Vitals:   06/01/19 0600 06/01/19 0615 06/01/19 0654 06/01/19 0809  BP: 133/77 129/77 137/80 133/80  Pulse: 93 91 98 (!) 106  Resp: 19 18  19   Temp:   98.1 F (36.7 C) 98.2 F (36.8 C)  TempSrc:   Oral Oral  SpO2: 96% 97% 90%   Weight:   113 kg   Height:   5\' 8"  (1.727 m)     Intake/Output Summary (Last 24 hours) at 06/01/2019 1241 Last data filed at 06/01/2019 1100 Gross per 24 hour  Intake 636.51 ml  Output 450 ml  Net  186.51 ml   Filed Weights   05/31/19 2311 06/01/19 0654  Weight: 112.5 kg 113 kg    Examination:  General exam: Appears calm and comfortable  Respiratory system: Clear to auscultation. Respiratory effort normal. Cardiovascular system: S1 & S2 heard, RRR. No JVD, murmurs, rubs, gallops or clicks. No pedal edema. Gastrointestinal system: Abdomen is nondistended, soft and nontender. No organomegaly or masses felt. Normal bowel sounds heard. Central nervous system: Alert and oriented. No focal neurological deficits. Extremities: Warm well perfused no edema Skin: No rashes, lesions or ulcers Psychiatry: Judgement and insight appear normal. Mood & affect appropriate.     Data Reviewed: I have personally reviewed following labs and imaging studies  CBC: Recent Labs  Lab 05/31/19 2324  WBC 9.7  NEUTROABS 7.6  HGB 10.4*  HCT 35.3*  MCV 91.7  PLT 696   Basic Metabolic Panel: Recent Labs  Lab 05/31/19 2324 06/01/19 0537  NA 138 138  K 4.0 4.0  CL 103 99  CO2 21* 26  GLUCOSE 148* 142*  BUN 25* 28*  CREATININE 1.51* 1.49*  CALCIUM 8.2* 8.8*  MG  --  1.4*   GFR: Estimated Creatinine Clearance: 56.2 mL/min (A) (by C-G formula based on SCr of 1.49 mg/dL (H)). Liver Function Tests: No results for input(s): AST, ALT, ALKPHOS, BILITOT, PROT, ALBUMIN in the last 168 hours. No results for input(s): LIPASE, AMYLASE in the last 168 hours. No results for input(s): AMMONIA in the last 168 hours. Coagulation Profile: No results for input(s): INR, PROTIME in the last 168 hours. Cardiac Enzymes: No results for input(s): CKTOTAL, CKMB, CKMBINDEX, TROPONINI in the last 168 hours. BNP (last 3 results) No results for input(s): PROBNP in the last 8760 hours. HbA1C: Recent Labs    06/01/19 0537  HGBA1C 8.4*   CBG: Recent Labs  Lab 06/01/19 0807 06/01/19 1129  GLUCAP 174* 223*   Lipid Profile: No results for input(s): CHOL, HDL, LDLCALC, TRIG, CHOLHDL, LDLDIRECT in the last 72  hours. Thyroid Function Tests: Recent Labs    05/31/19 2349  TSH 7.791*  FREET4 1.23*   Anemia Panel: No results for input(s): VITAMINB12, FOLATE, FERRITIN, TIBC, IRON, RETICCTPCT in the last 72 hours. Sepsis Labs: No results for input(s): PROCALCITON, LATICACIDVEN in the last 168 hours.  Recent Results (from the past 240 hour(s))  SARS CORONAVIRUS 2 (TAT 6-24 HRS) Nasopharyngeal Nasopharyngeal Swab     Status: None   Collection Time: 06/01/19  4:54 AM   Specimen: Nasopharyngeal Swab  Result Value Ref  Range Status   SARS Coronavirus 2 NEGATIVE NEGATIVE Final    Comment: (NOTE) SARS-CoV-2 target nucleic acids are NOT DETECTED. The SARS-CoV-2 RNA is generally detectable in upper and lower respiratory specimens during the acute phase of infection. Negative results do not preclude SARS-CoV-2 infection, do not rule out co-infections with other pathogens, and should not be used as the sole basis for treatment or other patient management decisions. Negative results must be combined with clinical observations, patient history, and epidemiological information. The expected result is Negative. Fact Sheet for Patients: HairSlick.no Fact Sheet for Healthcare Providers: quierodirigir.com This test is not yet approved or cleared by the [place] FDA and  has been authorized for detection and/or diagnosis of SARS-CoV-2 by FDA under an Emergency Use Authorization (EUA). This EUA will remain  in effect (meaning this test can be used) for the duration of the COVID-19 declaration under Section 56 4(b)(1) of the Act, 21 U.S.C. section 360bbb-3(b)(1), unless the authorization is terminated or revoked sooner. Performed at [facility], [address] [place], [place] [address]          Radiology Studies: DG Chest Port 1 View  Result Date: 05/31/2019 CLINICAL DATA:  Chest pain, STEMI, ventricular tachycardia, defibrillator  discharge EXAM: PORTABLE CHEST 1 VIEW COMPARISON:  Portable exam 2344 hours without prior exams available for comparison FINDINGS: LEFT subclavian AICD with leads projecting over RIGHT atrium and RIGHT ventricle. Normal heart size, mediastinal contours, and pulmonary vascularity. Lungs clear. No infiltrate, pleural effusion, or pneumothorax. Osseous structures unremarkable. IMPRESSION: No acute abnormalities. Electronically Signed   By: Ulyses Southward M.D.   On: 05/31/2019 23:52   ECHOCARDIOGRAM COMPLETE  Result Date: 06/01/2019    ECHOCARDIOGRAM REPORT   Patient Name:   Rachel Rivers Date of Exam: 06/01/2019 Medical Rec #:  [id number]       Height:       68.0 in Accession #:    [id number]      Weight:       249.2 lb Date of Birth:  28-Apr-1965       BSA:          2.244 m Patient Age:    54 years        BP:           133/87 mmHg Patient Gender: F               HR:           90 bpm. Exam Location:  Inpatient Procedure: 2D Echo, Color Doppler and Cardiac Doppler Indications:    Ventricular tachycardia I47.2  History:        Patient has no prior history of Echocardiogram examinations.                 CAD, Prior CABG and Defibrillator; Risk Factors:Diabetes and                 Hypertension.  Sonographer:    Ross Ludwig RDCS (AE) Referring Phys: [id number] [facility]  Sonographer Comments: Suboptimal subcostal window and patient is morbidly obese. Image acquisition challenging due to patient body habitus. IMPRESSIONS  1. Akinesis of the inferolateral wall with overall mild to moderate LV dysfunction; mild LVH; grade 2 diastolic dysfunction; mild MR; mild LAE; mild TR.  2. Left ventricular ejection fraction, by estimation, is 40 to 45%. The left ventricle has mild to moderately decreased function. The left ventricle demonstrates regional wall motion abnormalities (see scoring diagram/findings for description). There is  mild left ventricular hypertrophy. Left ventricular diastolic parameters are consistent with Grade II  diastolic dysfunction (pseudonormalization).  3. Right ventricular systolic function is normal. The right ventricular size is normal.  4. Left atrial size was mildly dilated.  5. The mitral valve is normal in structure. Mild mitral valve regurgitation. No evidence of mitral stenosis.  6. The aortic valve is tricuspid. Aortic valve regurgitation is not visualized. No aortic stenosis is present. FINDINGS  Left Ventricle: Left ventricular ejection fraction, by estimation, is 40 to 45%. The left ventricle has mild to moderately decreased function. The left ventricle demonstrates regional wall motion abnormalities. The left ventricular internal cavity size was normal in size. There is mild left ventricular hypertrophy. Left ventricular diastolic parameters are consistent with Grade II diastolic dysfunction (pseudonormalization). Right Ventricle: The right ventricular size is normal.Right ventricular systolic function is normal. Left Atrium: Left atrial size was mildly dilated. Right Atrium: Right atrial size was normal in size. Pericardium: There is no evidence of pericardial effusion. Mitral Valve: The mitral valve is normal in structure. Normal mobility of the mitral valve leaflets. Mild mitral annular calcification. Mild mitral valve regurgitation. No evidence of mitral valve stenosis. Tricuspid Valve: The tricuspid valve is normal in structure. Tricuspid valve regurgitation is mild . No evidence of tricuspid stenosis. Aortic Valve: The aortic valve is tricuspid. Aortic valve regurgitation is not visualized. No aortic stenosis is present. Aortic valve mean gradient measures 4.0 mmHg. Aortic valve peak gradient measures 5.7 mmHg. Aortic valve area, by VTI measures 2.65 cm. Pulmonic Valve: The pulmonic valve was normal in structure. Pulmonic valve regurgitation is not visualized. No evidence of pulmonic stenosis. Aorta: The aortic root is normal in size and structure. Venous: The inferior vena cava was not well  visualized. IAS/Shunts: No atrial level shunt detected by color flow Doppler. Additional Comments: Akinesis of the inferolateral wall with overall mild to moderate LV dysfunction; mild LVH; grade 2 diastolic dysfunction; mild MR; mild LAE; mild TR. A pacer wire is visualized.  LEFT VENTRICLE PLAX 2D LVIDd:         4.60 cm      Diastology LVIDs:         3.60 cm      LV e' lateral: 11.50 cm/s LV PW:         1.20 cm      LV e' medial:  6.64 cm/s LV IVS:        1.10 cm LVOT diam:     2.20 cm LV SV:         60 LV SV Index:   27 LVOT Area:     3.80 cm  LV Volumes (MOD) LV vol d, MOD A2C: 91.5 ml LV vol d, MOD A4C: 127.0 ml LV vol s, MOD A2C: 53.9 ml LV vol s, MOD A4C: 80.8 ml LV SV MOD A2C:     37.6 ml LV SV MOD A4C:     127.0 ml LV SV MOD BP:      41.8 ml RIGHT VENTRICLE RV Basal diam:  2.50 cm RV S prime:     5.98 cm/s TAPSE (M-mode): 1.3 cm LEFT ATRIUM             Index       RIGHT ATRIUM           Index LA diam:        4.30 cm 1.92 cm/m  RA Area:     13.20 cm LA Vol (A2C):   76.7 ml  34.18 ml/m RA Volume:   28.30 ml  12.61 ml/m LA Vol (A4C):   87.1 ml 38.81 ml/m LA Biplane Vol: 86.1 ml 38.37 ml/m  AORTIC VALVE AV Area (Vmax):    2.32 cm AV Area (Vmean):   2.36 cm AV Area (VTI):     2.65 cm AV Vmax:           119.00 cm/s AV Vmean:          90.200 cm/s AV VTI:            0.228 m AV Peak Grad:      5.7 mmHg AV Mean Grad:      4.0 mmHg LVOT Vmax:         72.60 cm/s LVOT Vmean:        55.900 cm/s LVOT VTI:          0.159 m LVOT/AV VTI ratio: 0.70  AORTA Ao Root diam: 3.10 cm MR Peak grad:    87.6 mmHg   TRICUSPID VALVE MR Mean grad:    60.0 mmHg   TR Peak grad:   34.8 mmHg MR Vmax:         468.00 cm/s TR Vmax:        295.00 cm/s MR Vmean:        366.0 cm/s MR PISA:         3.08 cm    SHUNTS MR PISA Eff ROA: 20 mm      Systemic VTI:  0.16 m MR PISA Radius:  0.70 cm     Systemic Diam: 2.20 cm Olga Millers MD Electronically signed by Olga Millers MD Signature Date/Time: 06/01/2019/11:53:04 AM    Final          Scheduled Meds: . acidophilus  1 capsule Oral Daily  . amiodarone  200 mg Oral Daily  . [START ON 06/02/2019] aspirin EC  81 mg Oral Daily  . atorvastatin  80 mg Oral QHS  . insulin aspart  0-5 Units Subcutaneous QHS  . insulin aspart  0-9 Units Subcutaneous TID WC  . insulin glargine  60 Units Subcutaneous Daily  . levothyroxine  100 mcg Oral Q0600  . metoprolol  200 mg Oral Daily  . multivitamin with minerals  1 tablet Oral Daily  . pantoprazole  40 mg Oral BID  . PARoxetine  10 mg Oral Daily  . sucralfate  1 g Oral QID   Continuous Infusions: . heparin 1,300 Units/hr (06/01/19 0452)  . magnesium sulfate bolus IVPB 2 g (06/01/19 1159)     LOS: 0 days    Time spent: 35 minutes    Burke Keels, MD [facility]  If 7PM-7AM, please contact night-coverage  06/01/2019, 12:41 PM

## 2019-06-01 NOTE — ED Notes (Signed)
Dr. Judd Lien notified of pt elevated troponin.

## 2019-06-01 NOTE — Progress Notes (Signed)
ANTICOAGULATION CONSULT NOTE - Initial Consult  Pharmacy Consult for heparin Indication: chest pain/ACS  Allergies  Allergen Reactions  . Mushroom Extract Complex Anaphylaxis  . 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 Iodinated Diagnostic Agents Hives  . Nsaids   . Lactose Diarrhea    Patient Measurements: Height: 5\' 8"  (172.7 cm) Weight: 112.5 kg (248 lb) IBW/kg (Calculated) : 63.9 Heparin Dosing Weight: 90kg  Vital Signs: Temp: 99.4 F (37.4 C) (04/03 2310) Temp Source: Oral (04/03 2310) BP: 130/76 (04/04 0400) Pulse Rate: 91 (04/04 0400)  Labs: Recent Labs    05/31/19 2324 06/01/19 0156  HGB 10.4*  --   HCT 35.3*  --   PLT 298  --   CREATININE 1.51*  --   TROPONINIHS 402* 3,603*    Estimated Creatinine Clearance: 55.4 mL/min (A) (by C-G formula based on SCr of 1.51 mg/dL (H)).   Medical History: Past Medical History:  Diagnosis Date  . GERD (gastroesophageal reflux disease)   . 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 Hypertension   . Iron deficiency anemia   . Restless leg syndrome   . Tachycardia   . Type 2 diabetes mellitus (HCC)     Assessment: 54yo female c/o CP and defib firing, troponin elevated and rising 54yo), to begin heparin.  Goal of Therapy:  Heparin level 0.3-0.7 units/ml Monitor platelets by anticoagulation protocol: Yes   Plan:  Will give heparin 4000 units IV bolus x1 followed by gtt at 1300 units/hr and monitor heparin levels and CBC.  ([phone], PharmD, BCPS  06/01/2019,4:21 AM

## 2019-06-01 NOTE — H&P (Signed)
History and Physical    Rachel Rivers UKG:[id number] DOB: [birth year] DOA: 05/31/2019  PCP: Selinda Flavin, MD Patient coming from: Home  Chief Complaint: Defibrillator firing  HPI: Rachel Rivers is a 54 y.o. female with medical history significant of CAD status post multiple PCI's and CABG, VT on amiodarone with ICD in place, hypertension, type 2 diabetes, CVA being brought to the ED for evaluation of defibrillator firing.  Patient states she was bending down scrubbing the floor.  Upon standing up straight she felt lightheaded and her heart started racing.  She then went to the bathroom and while sitting down on toilet seat, received a shock from her ICD.  She then walked to her bedroom and soon after sitting down received another 4 shocks from the ICD.  She was not having any chest pain previously but after receiving shocks she started experiencing substernal nonradiating pressure-like chest pain associated with diaphoresis and nausea.  States she had already taken her metoprolol and amiodarone yesterday morning.  She then took additional doses of each medication prior to EMS arrival.  In addition, she took 2 baby aspirin's and 3 sublingual nitroglycerin tablets.  Chest pain was only partially relieved after the 3 doses of nitroglycerin.  At present, states she is not having chest pressure but instead having slight muscle soreness in her chest.  ED Course: Slightly tachycardic and tachypneic on arrival.    Chest x-ray showing no acute abnormalities  Patient was seen by cardiology.  ICD interrogation revealed inappropriate shocks for sinus tachycardia.  Initial high-sensitivity troponin 402, repeat 3603.  Cardiology recommended treating as NSTEMI, cycling troponins, and repeating echocardiogram.  If echocardiogram reveals wall motion abnormality or reduced EF, cardiology may consider LHC on Monday.  EP will be consulted by cardiology.  Patient received Maalox and Zofran.  Review of Systems:  All  systems reviewed and apart from history of presenting illness, are negative.  Past Medical History:  Diagnosis Date  . GERD (gastroesophageal reflux disease)   . 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 Hypertension   . Iron deficiency anemia   . Restless leg syndrome   . Tachycardia   . Type 2 diabetes mellitus (HCC)     Past surgical history: Per care everywhere records. Procedure Laterality Date  . Cardiac catheterization  3 stents  . Cardiac defibrillator placement  . Cholecystectomy  . Colonoscopy  +54yrs ago  . Colonoscopy N/A 06/11/2017  Procedure: COLONOSCOPY; Surgeon: Arn Medal, MD; Location: [facility] ENDO; Service: Gastroenterology; Laterality: N/A;  . Coronary artery bypass graft 12/10/2015  CABG (CORONARY ARTERY BYPASS GRAFT)x4 LIMA EVH, closure with sternalock 360 - Dr. Lee--NO CHEST PAIN SINCE  . Evacuation clot from brain  . Roux-n-y  . Tonsillectomy and adenoidectomy  . Upper gastrointestinal endoscopy 05/2017  bleeding ulcer at anastamosis    reports that she quit smoking about 39 years ago. She has never used smokeless tobacco. No history on file for alcohol and drug.  Allergies  Allergen Reactions  . Mushroom Extract Complex Anaphylaxis  . 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 Iodinated Diagnostic Agents Hives  . Nsaids   . Lactose Diarrhea    History reviewed. No pertinent family history.  Prior to Admission medications   Medication Sig Start Date End Date Taking? Authorizing Provider  acetaminophen (TYLENOL) 500 MG tablet Take 1,000 mg  by mouth at bedtime.   Yes [provider]  amiodarone (PACERONE) 200 MG tablet Take 200 mg by mouth every morning. 05/06/18  Yes [provider]  apixaban (ELIQUIS) 5 MG TABS tablet Take 5 mg by mouth 2 (two) times  daily. 05/06/18  Yes [provider]  aspirin EC 81 MG tablet Take 1 tablet (81 mg total) by mouth daily. 10/05/16  Yes Laqueta Linden, MD  atorvastatin (LIPITOR) 80 MG tablet Take 80 mg by mouth at bedtime.    Yes [provider]  Calcium Carbonate-Vitamin D (CALCIUM-D PO) Take by mouth.   Yes [provider]  cannabidiol (EPIDIOLEX) 100 MG/ML solution Take 500 mg by mouth at bedtime.   Yes [provider]  Cholecalciferol (VITAMIN D PO) Take 100 Units by mouth daily.   Yes [provider]  diphenhydrAMINE (BENADRYL) 50 MG tablet Take 50 mg by mouth at bedtime.   Yes [provider]  docusate sodium (COLACE) 100 MG capsule Take 100 mg by mouth daily as needed for mild constipation.    Yes [provider]  furosemide (LASIX) 20 MG tablet Take 20 mg by mouth daily. 05/10/17  Yes [provider]  hydrochlorothiazide (HYDRODIURIL) 25 MG tablet Take 25 mg by mouth daily. 05/06/15  Yes [provider]  Lactobacillus Rhamnosus, GG, (RA PROBIOTIC DIGESTIVE CARE) CAPS Take 1 capsule by mouth daily.   Yes [provider]  levothyroxine (SYNTHROID) 100 MCG tablet Take 100 mcg by mouth daily before breakfast. 04/17/19  Yes [provider]  metFORMIN (GLUCOPHAGE) 1000 MG tablet Take 1,000 mg by mouth 2 (two) times daily. 03/06/19  Yes [provider]  metoprolol (TOPROL-XL) 200 MG 24 hr tablet Take 200 mg by mouth daily.   Yes [provider]  Multiple Vitamin (MULTIVITAMIN WITH MINERALS) TABS tablet Take 1 tablet by mouth daily.   Yes [provider]  nitroGLYCERIN (NITROLINGUAL) 0.4 MG/SPRAY spray Place 1 spray under the tongue every 5 (five) minutes x 3 doses as needed for chest pain.  08/07/16  Yes [provider]  NOVOLOG FLEXPEN 100 UNIT/ML FlexPen Inject 26-40 Units into the skin See admin instructions. Inject 26 units into the skin 15 minutes before meals. Add 2 units per  glucose 50 above 150. 05/19/19  Yes [provider]  ondansetron (ZOFRAN) 4 MG tablet Take 4 mg by mouth every 8 (eight) hours as needed for nausea.  07/25/16  Yes [provider]  pantoprazole (PROTONIX) 40 MG tablet Take 40 mg by mouth 2 (two) times daily. 05/06/15  Yes [provider]  PARoxetine (PAXIL) 10 MG tablet Take 10 mg by mouth daily.   Yes [provider]  polyethylene glycol (MIRALAX / GLYCOLAX) packet Take 17 g by mouth daily as needed for mild constipation.    Yes [provider]  quinapril (ACCUPRIL) 5 MG tablet TAKE ONE TABLET BY MOUTH DAILY. Patient taking differently: Take 5 mg by mouth daily.  03/14/18  Yes Laqueta Linden, MD  sucralfate (CARAFATE) 1 g tablet Take 1 g by mouth 4 (four) times daily.  03/27/17  Yes [provider]  TOUJEO MAX SOLOSTAR 300 UNIT/ML Solostar Pen Inject 60 Units into the skin every morning. 05/29/19  Yes [provider]    Physical Exam: Vitals:   06/01/19 0245 06/01/19 0300 06/01/19 0330 06/01/19 0345  BP: 123/74 126/83 131/84 129/77  Pulse: 90 93 93 91  Resp: (!) 23 (!) 26 (!) 22 (!) 23  Temp:      TempSrc:      SpO2: 99% 94% 99% 97%  Weight:      Height:        Physical Exam  Constitutional: She is oriented to person, place, and time. She appears well-developed and well-nourished. No distress.  HENT:  Head: Normocephalic.  Eyes: Right eye exhibits no discharge. Left eye exhibits no discharge.  Cardiovascular: Normal rate, regular rhythm and intact distal pulses.  Pulmonary/Chest: Breath sounds normal. No respiratory distress. She has no wheezes. She has no rales.  Abdominal: Soft. Bowel sounds are normal. She exhibits no distension. There is no abdominal tenderness. There is no guarding.  Musculoskeletal:        General: Edema present.     Cervical back: Neck supple.     Comments: Bilateral lower extremity edema (chronic and unchanged per patient)  Neurological: She is  alert and oriented to person, place, and time.  Skin: Skin is warm and dry. She is not diaphoretic.     Labs on Admission: I have personally reviewed following labs and imaging studies  CBC: Recent Labs  Lab 05/31/19 2324  WBC 9.7  NEUTROABS 7.6  HGB 10.4*  HCT 35.3*  MCV 91.7  PLT 298   Basic Metabolic Panel: Recent Labs  Lab 05/31/19 2324  NA 138  K 4.0  CL 103  CO2 21*  GLUCOSE 148*  BUN 25*  CREATININE 1.51*  CALCIUM 8.2*   GFR: Estimated Creatinine Clearance: 55.4 mL/min (A) (by C-G formula based on SCr of 1.51 mg/dL (H)). Liver Function Tests: No results for input(s): AST, ALT, ALKPHOS, BILITOT, PROT, ALBUMIN in the last 168 hours. No results for input(s): LIPASE, AMYLASE in the last 168 hours. No results for input(s): AMMONIA in the last 168 hours. Coagulation Profile: No results for input(s): INR, PROTIME in the last 168 hours. Cardiac Enzymes: No results for input(s): CKTOTAL, CKMB, CKMBINDEX, TROPONINI in the last 168 hours. BNP (last 3 results) No results for input(s): PROBNP in the last 8760 hours. HbA1C: No results for input(s): HGBA1C in the last 72 hours. CBG: No results for input(s): GLUCAP in the last 168 hours. Lipid Profile: No results for input(s): CHOL, HDL, LDLCALC, TRIG, CHOLHDL, LDLDIRECT in the last 72 hours. Thyroid Function Tests: Recent Labs    05/31/19 2349  TSH 7.791*  FREET4 1.23*   Anemia Panel: No results for input(s): VITAMINB12, FOLATE, FERRITIN, TIBC, IRON, RETICCTPCT in the last 72 hours. Urine analysis: No results found for: COLORURINE, APPEARANCEUR, LABSPEC, PHURINE, GLUCOSEU, HGBUR, BILIRUBINUR, KETONESUR, PROTEINUR, UROBILINOGEN, NITRITE, LEUKOCYTESUR  Radiological Exams on Admission: DG Chest Port 1 View  Result Date: 05/31/2019 CLINICAL DATA:  Chest pain, STEMI, ventricular tachycardia, defibrillator discharge EXAM: PORTABLE CHEST 1 VIEW COMPARISON:  Portable exam 2344 hours without prior exams available for  comparison FINDINGS: LEFT subclavian AICD with leads projecting over RIGHT atrium and RIGHT ventricle. Normal heart size, mediastinal contours, and pulmonary vascularity. Lungs clear. No infiltrate, pleural effusion, or pneumothorax. Osseous structures unremarkable. IMPRESSION: No acute abnormalities. Electronically Signed   By: Ulyses Southward M.D.   On: 05/31/2019 23:52    EKG: Independently reviewed.  Paced rhythm.  Assessment/Plan Principal Problem:   NSTEMI (non-ST elevated myocardial infarction) (HCC) Active Problems:   Inappropriate shocks from ICD (implantable cardioverter-defibrillator)   Anemia   AKI (acute kidney injury) (HCC)   Abnormal results of thyroid function studies   Inappropriate shocks from ICD/possible NSTEMI: Patient was seen by cardiology.  ICD interrogation revealed inappropriate shocks  for sinus tachycardia.  Troponin was elevated on repeat check.  Cardiology recommended treating as NSTEMI, cycling troponins, and repeating echocardiogram.  If echocardiogram reveals wall motion abnormality or reduced EF, cardiology may consider LHC on Monday.  EP will be consulted by cardiology.   -Plan: Cardiac monitoring.  Aspirin, heparin drip per ACS protocol.  Keep potassium above 2 and magnesium above 4.  Continue home amiodarone and metoprolol.  Normocytic anemia: Hemoglobin 10.4, baseline in the 8-9 range in 2019 per care everywhere.  Continue to monitor.  AKI: BUN 25, creatinine 1.5. Creatinine was 1.0 on labs done in January 2021 per care everywhere. Hold home diuretics and ACE inhibitor.  Repeat BMP in a.m.  Monitor urine output.  Hypothyroidism: TSH elevated at 7.7, free T4 borderline elevated at 1.2.  Continue home Synthroid and repeat thyroid function studies in 4 to 6 weeks.  Uncontrolled insulin-dependent type 2 diabetes: A1c 9.6 on 03/06/2019.  Order repeat A1c, continue home long-acting insulin, sliding scale insulin sensitive ACHS, and CBG checks.  Hypertension:  Currently normotensive  DVT prophylaxis: Heparin Code Status: Full code Family Communication: No family available at this time. Disposition Plan: Needs IV heparin at this time.  May need LHC on Monday. Consults called: Cardiology Admission status: It is my clinical opinion that admission to INPATIENT is reasonable and necessary because of the expectation that this patient will require hospital care that crosses at least 2 midnights to treat this condition based on the medical complexity of the problems presented.  Given the aforementioned information, the predictability of an adverse outcome is felt to be significant.  The medical decision making on this patient was of high complexity and the patient is at high risk for clinical deterioration, therefore this is a level 3 visit.  Shela Leff MD [facility]  If 7PM-7AM, please contact night-coverage [email or web address]  06/01/2019, 3:57 AM

## 2019-06-01 NOTE — ED Notes (Signed)
Dr. Delo notified of pt elevated troponin. 

## 2019-06-01 NOTE — Consult Note (Signed)
Cardiology Consultation:   Patient ID: Rachel Rivers MRN: [id number]; DOB: 10/16/1965  Admit date: 05/31/2019 Date of Consult: 06/01/2019  Primary Care Provider: Rory Percy, MD Primary Cardiologist: Rachel Rivers Primary Electrophysiologist:  Rachel Rivers   Patient Profile:   Rachel Rivers is a 54 y.o. female with a hx of coronary artery disease status post multiple PCI and four-vessel CABG in 2017, prior gastric bypass, diabetes, VT on amiodarone, [employer] ICD, and hypertension who is being seen today for the evaluation of multiple ICD shocks at the request of Rachel Rivers.  History of Present Illness:   Rachel Rivers was in her usual state of health until yesterday evening.  She was cleaning up after her dog.  She was bending down to the ground.  She received 5 ICD shocks.  She was not having chest pain prior to her shocks but did have chest pain afterwards.  She took an extra dose of amiodarone and metoprolol while waiting for EMS.  She denies chest pain or palpitations.  No orthopnea or PND.  She has been weighing herself without change in daily weights.  She was shocked last with rapid atrial fibrillation.   Past Medical History:  Diagnosis Date  . GERD (gastroesophageal reflux disease)   . Hemiparesis and alteration of sensations as late effects of stroke (Reader) 04/25/2017  . Hypertension   . Iron deficiency anemia   . Restless leg syndrome   . Tachycardia   . Type 2 diabetes mellitus (HCC)     No past surgical history on file.   Home Medications:  Prior to Admission medications   Medication Sig Start Date End Date Taking? Authorizing Provider  acetaminophen (TYLENOL) 500 MG tablet Take 1,000 mg by mouth at bedtime.   Yes [provider]  amiodarone (PACERONE) 200 MG tablet Take 200 mg by mouth every morning. 05/06/18  Yes [provider]  apixaban (ELIQUIS) 5 MG TABS tablet Take 5 mg by mouth 2 (two) times daily. 05/06/18  Yes [provider]    aspirin EC 81 MG tablet Take 1 tablet (81 mg total) by mouth daily. 10/05/16  Yes Rachel Commons, MD  atorvastatin (LIPITOR) 80 MG tablet Take 80 mg by mouth at bedtime.    Yes [provider]  Calcium Carbonate-Vitamin D (CALCIUM-D PO) Take by mouth.   Yes [provider]  cannabidiol (EPIDIOLEX) 100 MG/ML solution Take 500 mg by mouth at bedtime.   Yes [provider]  Cholecalciferol (VITAMIN D PO) Take 100 Units by mouth daily.   Yes [provider]  diphenhydrAMINE (BENADRYL) 50 MG tablet Take 50 mg by mouth at bedtime.   Yes [provider]  docusate sodium (COLACE) 100 MG capsule Take 100 mg by mouth daily as needed for mild constipation.    Yes [provider]  furosemide (LASIX) 20 MG tablet Take 20 mg by mouth daily. 05/10/17  Yes [provider]  hydrochlorothiazide (HYDRODIURIL) 25 MG tablet Take 25 mg by mouth daily. 05/06/15  Yes [provider]  Lactobacillus Rhamnosus, GG, (RA PROBIOTIC DIGESTIVE CARE) CAPS Take 1 capsule by mouth daily.   Yes [provider]  levothyroxine (SYNTHROID) 100 MCG tablet Take 100 mcg by mouth daily before breakfast. 04/17/19  Yes [provider]  metFORMIN (GLUCOPHAGE) 1000 MG tablet Take 1,000 mg by mouth 2 (two) times daily. 03/06/19  Yes [provider]  metoprolol (TOPROL-XL) 200 MG 24 hr tablet Take 200 mg by mouth daily.   Yes [provider]  Multiple Vitamin (MULTIVITAMIN WITH MINERALS) TABS tablet Take 1 tablet by mouth daily.   Yes [provider]  nitroGLYCERIN (NITROLINGUAL) 0.4 MG/SPRAY spray Place 1 spray under the tongue every 5 (five) minutes x 3 doses as needed for chest pain.  08/07/16  Yes [provider]  NOVOLOG FLEXPEN 100 UNIT/ML FlexPen Inject 26-40 Units into the skin See admin instructions. Inject 26 units into the skin 15 minutes before meals. Add 2 units per glucose 50 above 150. 05/19/19  Yes [provider]  ondansetron (ZOFRAN) 4 MG tablet Take 4 mg by mouth every 8 (eight) hours as needed for nausea.  07/25/16  Yes [provider]  pantoprazole (PROTONIX) 40 MG tablet Take 40 mg by mouth 2 (two) times daily. 05/06/15  Yes [provider]  PARoxetine (PAXIL) 10 MG tablet Take 10 mg by mouth daily.   Yes [provider]  polyethylene glycol (MIRALAX / GLYCOLAX) packet Take 17 g by mouth daily as needed for mild constipation.    Yes [provider]  quinapril (ACCUPRIL) 5 MG tablet TAKE ONE TABLET BY MOUTH DAILY. Patient taking differently: Take 5 mg by mouth daily.  03/14/18  Yes Rachel Linden, MD  sucralfate (CARAFATE) 1 g tablet Take 1 g by mouth 4 (four) times daily.  03/27/17  Yes [provider]  TOUJEO MAX SOLOSTAR 300 UNIT/ML Solostar Pen Inject 60 Units into the skin every morning. 05/29/19  Yes [provider]    Inpatient Medications: Scheduled Meds: . acidophilus  1 capsule Oral Daily  . amiodarone  200 mg Oral Daily  . [START ON 06/02/2019] aspirin EC  81 mg Oral Daily  . atorvastatin  80 mg Oral QHS  . insulin aspart  0-5 Units Subcutaneous QHS  . insulin aspart  0-9 Units Subcutaneous TID WC  . insulin glargine  60 Units Subcutaneous Daily  . levothyroxine  100 mcg Oral Q0600  . metoprolol  200 mg Oral Daily  . multivitamin with minerals  1 tablet Oral Daily  . pantoprazole  40 mg Oral BID  . PARoxetine  10 mg Oral Daily  . sucralfate  1 g Oral QID   Continuous Infusions: . heparin 1,300 Units/hr (06/01/19 0452)  . magnesium sulfate bolus IVPB     PRN Meds: acetaminophen, polyethylene glycol  Allergies:    Allergies  Allergen Reactions  . Mushroom Extract Complex Anaphylaxis  . 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 Iodinated Diagnostic Agents Hives  . Nsaids     . Lactose Diarrhea    Social History:   Social History   Socioeconomic History  . Marital status: Married    Spouse name: Not on file  . Number of children: Not on file  . Years of education: Not on file  . Highest education level: Not on file  Occupational History  . Not on file  Tobacco Use  . Smoking status: Former Smoker    Quit date: 03/04/1980    Years since quitting: 39.2  . Smokeless tobacco: Never Used  Substance and Sexual Activity  . Alcohol use: Not on file  . Drug use: Not on file  . Sexual activity: Not on file  Other Topics Concern  . Not on file  Social History Narrative  . Not on file   Social Determinants of Health   Financial Resource Strain:   .  Difficulty of Paying Living Expenses:   Food Insecurity:   . Worried About Programme researcher, broadcasting/film/video in the Last Year:   . Barista in the Last Year:   Transportation Needs:   . Freight forwarder (Medical):   Marland Kitchen Lack of Transportation (Non-Medical):   Physical Activity:   . Days of Exercise per Week:   . Minutes of Exercise per Session:   Stress:   . Feeling of Stress :   Social Connections:   . Frequency of Communication with Friends and Family:   . Frequency of Social Gatherings with Friends and Family:   . Attends Religious Services:   . Active Member of Clubs or Organizations:   . Attends Banker Meetings:   Marland Kitchen Marital Status:   Intimate Partner Violence:   . Fear of Current or Ex-Partner:   . Emotionally Abused:   Marland Kitchen Physically Abused:   . Sexually Abused:     Family History:    Family History  Problem Relation Age of Onset  . Stroke Maternal Grandmother      ROS:  Please see the history of present illness.   All other ROS reviewed and negative.     Physical Exam/Data:   Vitals:   06/01/19 0600 06/01/19 0615 06/01/19 0654 06/01/19 0809  BP: 133/77 129/77 137/80 133/80  Pulse: 93 91 98 (!) 106  Resp: 19 18  19   Temp:   98.1 F (36.7 C) 98.2 F (36.8 C)   TempSrc:   Oral Oral  SpO2: 96% 97% 90%   Weight:   113 kg   Height:   5\' 8"  (1.727 m)     Intake/Output Summary (Last 24 hours) at 06/01/2019 0956 Last data filed at 06/01/2019 0800 Gross per 24 hour  Intake 570.68 ml  Output 200 ml  Net 370.68 ml   Last 3 Weights 06/01/2019 05/31/2019 04/25/2017  Weight (lbs) 249 lb 3.2 oz 248 lb 224 lb  Weight (kg) 113.036 kg 112.492 kg 101.606 kg     Body mass index is 37.89 kg/m.  General:  Well nourished, well developed, in no acute distress HEENT: normal Lymph: no adenopathy Neck: no JVD Endocrine:  No thryomegaly Vascular: No carotid bruits; FA pulses 2+ bilaterally without bruits  Cardiac:  normal S1, S2; RRR; no murmur  Lungs:  clear to auscultation bilaterally, no wheezing, rhonchi or rales  Abd: soft, nontender, no hepatomegaly  Ext: no edema Musculoskeletal:  No deformities, BUE and BLE strength normal and equal Skin: warm and dry  Neuro:  CNs 2-12 intact, no focal abnormalities noted Psych:  Normal affect   EKG:  The EKG was personally reviewed and demonstrates: Sinus tachycardia with ventricular pacing Telemetry:  Telemetry was personally reviewed and demonstrates: Sinus rhythm  Relevant CV Studies: Echo pending  Laboratory Data:  High Sensitivity Troponin:   Recent Labs  Lab 05/31/19 2324 06/01/19 0156 06/01/19 0537  TROPONINIHS 402* 3,603* 6,277*     Chemistry Recent Labs  Lab 05/31/19 2324 06/01/19 0537  NA 138 138  K 4.0 4.0  CL 103 99  CO2 21* 26  GLUCOSE 148* 142*  BUN 25* 28*  CREATININE 1.51* 1.49*  CALCIUM 8.2* 8.8*  GFRNONAA 39* 39*  GFRAA 45* 45*  ANIONGAP 14 13    No results for input(s): PROT, ALBUMIN, AST, ALT, ALKPHOS, BILITOT in the last 168 hours. Hematology Recent Labs  Lab 05/31/19 2324  WBC 9.7  RBC 3.85*  HGB 10.4*  HCT 35.3*  MCV 91.7  MCH 27.0  MCHC 29.5*  RDW 17.8*  PLT 298   BNPNo results for input(s): BNP, PROBNP in the last 168 hours.  DDimer No results for input(s):  DDIMER in the last 168 hours.   Radiology/Studies:  DG Chest Port 1 View  Result Date: 05/31/2019 CLINICAL DATA:  Chest pain, STEMI, ventricular tachycardia, defibrillator discharge EXAM: PORTABLE CHEST 1 VIEW COMPARISON:  Portable exam 2344 hours without prior exams available for comparison FINDINGS: LEFT subclavian AICD with leads projecting over RIGHT atrium and RIGHT ventricle. Normal heart size, mediastinal contours, and pulmonary vascularity. Lungs clear. No infiltrate, pleural effusion, or pneumothorax. Osseous structures unremarkable. IMPRESSION: No acute abnormalities. Electronically Signed   By: Ulyses Southward M.D.   On: 05/31/2019 23:52    Assessment and Plan:   1. ICD shocks: Reported VT, though I cannot find her device interrogation.  Dinita Migliaccio discuss with [employer] to get her device interrogated and see if she is actually appropriately shocked.  She has had ICD shocks for atrial fibrillation in the past.  Unfortunately, her troponin is elevated to 6000 today.  She would thus benefit from left heart catheterization tomorrow. 2. Chronic systolic heart failure due to ischemic cardiomyopathy: No obvious volume overload.  Patient is status post Environmental manager dual-chamber ICD.  We'll continue metoprolol.  Kylar Leonhardt hold quinapril and potentially switch to [facility] pending echo and cath. 3. Non-STEMI: Patient with coronary artery disease status post CABG.  We'll plan for left heart catheterization tomorrow.      For questions or updates, please contact [facility] Please consult [email or web address] for contact info under     Signed, Sybella Harnish Jorja Loa, MD  06/01/2019 9:56 AM

## 2019-06-01 NOTE — ED Notes (Signed)
Breakfast Ordered 

## 2019-06-01 NOTE — Plan of Care (Signed)
  Problem: Education: Goal: Knowledge of General Education information will improve Description Including pain rating scale, medication(s)/side effects and non-pharmacologic comfort measures Outcome: Progressing   

## 2019-06-02 ENCOUNTER — Encounter (HOSPITAL_COMMUNITY): Payer: Self-pay | Admitting: Internal Medicine

## 2019-06-02 ENCOUNTER — Encounter (HOSPITAL_COMMUNITY)
Admission: EM | Disposition: A | Discharge status: Home / Self Care | Payer: Self-pay | Source: Home / Self Care | Attending: Internal Medicine

## 2019-06-02 DIAGNOSIS — N183 Chronic kidney disease, stage 3 unspecified: Secondary | ICD-10-CM

## 2019-06-02 DIAGNOSIS — R7989 Other specified abnormal findings of blood chemistry: Secondary | ICD-10-CM | POA: Diagnosis present

## 2019-06-02 DIAGNOSIS — N189 Chronic kidney disease, unspecified: Secondary | ICD-10-CM

## 2019-06-02 DIAGNOSIS — I2510 Atherosclerotic heart disease of native coronary artery without angina pectoris: Secondary | ICD-10-CM

## 2019-06-02 DIAGNOSIS — N179 Acute kidney failure, unspecified: Secondary | ICD-10-CM

## 2019-06-02 DIAGNOSIS — R778 Other specified abnormalities of plasma proteins: Secondary | ICD-10-CM

## 2019-06-02 DIAGNOSIS — I5043 Acute on chronic combined systolic (congestive) and diastolic (congestive) heart failure: Secondary | ICD-10-CM

## 2019-06-02 HISTORY — PX: LEFT HEART CATH AND CORS/GRAFTS ANGIOGRAPHY: CATH118250

## 2019-06-02 LAB — BASIC METABOLIC PANEL
Anion gap: 13 (ref 5–15)
BUN: 27 mg/dL — ABNORMAL HIGH (ref 6–20)
CO2: 28 mmol/L (ref 22–32)
Calcium: 8.7 mg/dL — ABNORMAL LOW (ref 8.9–10.3)
Chloride: 95 mmol/L — ABNORMAL LOW (ref 98–111)
Creatinine, Ser: 1.57 mg/dL — ABNORMAL HIGH (ref 0.44–1.00)
GFR calc Af Amer: 43 mL/min — ABNORMAL LOW (ref >60)
GFR calc non Af Amer: 37 mL/min — ABNORMAL LOW (ref >60)
Glucose, Bld: 335 mg/dL — ABNORMAL HIGH (ref 70–99)
Potassium: 4.8 mmol/L (ref 3.5–5.1)
Sodium: 136 mmol/L (ref 135–145)

## 2019-06-02 LAB — CBC
HCT: 36.2 % (ref 36.0–46.0)
Hemoglobin: 11.1 g/dL — ABNORMAL LOW (ref 12.0–15.0)
MCH: 27.5 pg (ref 26.0–34.0)
MCHC: 30.7 g/dL (ref 30.0–36.0)
MCV: 89.8 fL (ref 80.0–100.0)
Platelets: 289 10*3/uL (ref 150–400)
RBC: 4.03 MIL/uL (ref 3.87–5.11)
RDW: 17.2 % — ABNORMAL HIGH (ref 11.5–15.5)
WBC: 13.4 10*3/uL — ABNORMAL HIGH (ref 4.0–10.5)
nRBC: 0 % (ref 0.0–0.2)

## 2019-06-02 LAB — GLUCOSE, CAPILLARY
Glucose-Capillary: 307 mg/dL — ABNORMAL HIGH (ref 70–99)
Glucose-Capillary: 324 mg/dL — ABNORMAL HIGH (ref 70–99)
Glucose-Capillary: 269 mg/dL — ABNORMAL HIGH (ref 70–99)
Glucose-Capillary: 305 mg/dL — ABNORMAL HIGH (ref 70–99)
Glucose-Capillary: 324 mg/dL — ABNORMAL HIGH (ref 70–99)

## 2019-06-02 LAB — APTT: aPTT: 88 seconds — ABNORMAL HIGH (ref 24–36)

## 2019-06-02 LAB — HCG, SERUM, QUALITATIVE: Preg, Serum: NEGATIVE

## 2019-06-02 LAB — HEPARIN LEVEL (UNFRACTIONATED): Heparin Unfractionated: 1.62 IU/mL — ABNORMAL HIGH (ref 0.30–0.70)

## 2019-06-02 LAB — MAGNESIUM: Magnesium: 2 mg/dL (ref 1.7–2.4)

## 2019-06-02 LAB — POCT ACTIVATED CLOTTING TIME: Activated Clotting Time: 131 seconds

## 2019-06-02 SURGERY — LEFT HEART CATH AND CORS/GRAFTS ANGIOGRAPHY
Anesthesia: LOCAL

## 2019-06-02 MED ORDER — INSULIN GLARGINE 100 UNIT/ML ~~LOC~~ SOLN
40.0000 [IU] | Freq: Once | SUBCUTANEOUS | Status: AC
  Administered 2019-06-02: 40 [IU] via SUBCUTANEOUS
  Filled 2019-06-02: qty 0.4

## 2019-06-02 MED ORDER — SODIUM CHLORIDE 0.9% FLUSH: 3.0000 mL | INTRAVENOUS | Status: DC | PRN

## 2019-06-02 MED ORDER — ONDANSETRON HCL 4 MG/2ML IJ SOLN: 4.0000 mg | Freq: Four times a day (QID) | INTRAMUSCULAR | Status: DC | PRN

## 2019-06-02 MED ORDER — INSULIN GLARGINE 100 UNIT/ML ~~LOC~~ SOLN
60.0000 [IU] | Freq: Every day | SUBCUTANEOUS | Status: DC
  Administered 2019-06-03 – 2019-06-04 (×2): 60 [IU] via SUBCUTANEOUS
  Filled 2019-06-02 (×2): qty 0.6

## 2019-06-02 MED ORDER — SODIUM CHLORIDE 0.9% FLUSH
3.0000 mL | Freq: Two times a day (BID) | INTRAVENOUS | Status: DC
  Administered 2019-06-02 – 2019-06-03 (×2): 3 mL via INTRAVENOUS

## 2019-06-02 MED ORDER — APIXABAN 5 MG PO TABS
5.0000 mg | ORAL_TABLET | Freq: Two times a day (BID) | ORAL | Status: DC
  Administered 2019-06-03 – 2019-06-04 (×3): 5 mg via ORAL
  Filled 2019-06-02 (×3): qty 1

## 2019-06-02 MED ORDER — IOHEXOL 350 MG/ML SOLN
INTRAVENOUS | Status: DC | PRN
  Administered 2019-06-02: 80 mL

## 2019-06-02 MED ORDER — LIDOCAINE HCL (PF) 1 % IJ SOLN
INTRAMUSCULAR | Status: DC | PRN
  Administered 2019-06-02: 15 mL

## 2019-06-02 MED ORDER — HEPARIN (PORCINE) IN NACL 1000-0.9 UT/500ML-% IV SOLN
INTRAVENOUS | Status: AC
  Filled 2019-06-02: qty 500

## 2019-06-02 MED ORDER — MIDAZOLAM HCL 2 MG/2ML IJ SOLN
INTRAMUSCULAR | Status: AC
  Filled 2019-06-02: qty 2

## 2019-06-02 MED ORDER — MIDAZOLAM HCL 2 MG/2ML IJ SOLN
INTRAMUSCULAR | Status: DC | PRN
  Administered 2019-06-02: 2 mg via INTRAVENOUS

## 2019-06-02 MED ORDER — METOPROLOL TARTRATE 12.5 MG HALF TABLET: 12.5000 mg | ORAL_TABLET | Freq: Two times a day (BID) | ORAL | Status: DC

## 2019-06-02 MED ORDER — HEPARIN (PORCINE) IN NACL 1000-0.9 UT/500ML-% IV SOLN
INTRAVENOUS | Status: DC | PRN
  Administered 2019-06-02 (×2): 500 mL

## 2019-06-02 MED ORDER — LIDOCAINE HCL (PF) 1 % IJ SOLN
INTRAMUSCULAR | Status: AC
  Filled 2019-06-02: qty 30

## 2019-06-02 MED ORDER — DIAZEPAM 5 MG PO TABS
5.0000 mg | ORAL_TABLET | ORAL | Status: DC | PRN
  Administered 2019-06-02 – 2019-06-04 (×3): 5 mg via ORAL
  Filled 2019-06-02 (×3): qty 1

## 2019-06-02 MED ORDER — HYDRALAZINE HCL 20 MG/ML IJ SOLN: 10.0000 mg | INTRAMUSCULAR | Status: AC | PRN

## 2019-06-02 MED ORDER — ACETAMINOPHEN 325 MG PO TABS
650.0000 mg | ORAL_TABLET | ORAL | Status: DC | PRN
  Administered 2019-06-02 – 2019-06-04 (×4): 650 mg via ORAL
  Filled 2019-06-02 (×4): qty 2

## 2019-06-02 MED ORDER — FENTANYL CITRATE (PF) 100 MCG/2ML IJ SOLN
INTRAMUSCULAR | Status: AC
  Filled 2019-06-02: qty 2

## 2019-06-02 MED ORDER — FENTANYL CITRATE (PF) 100 MCG/2ML IJ SOLN
INTRAMUSCULAR | Status: DC | PRN
  Administered 2019-06-02: 25 ug via INTRAVENOUS

## 2019-06-02 MED ORDER — SODIUM CHLORIDE 0.9 % IV SOLN: INTRAVENOUS | Status: AC

## 2019-06-02 MED ORDER — SODIUM CHLORIDE 0.9 % IV SOLN: 250.0000 mL | INTRAVENOUS | Status: DC | PRN

## 2019-06-02 MED ORDER — LABETALOL HCL 5 MG/ML IV SOLN: 10.0000 mg | INTRAVENOUS | Status: AC | PRN

## 2019-06-02 MED ORDER — AMLODIPINE BESYLATE 5 MG PO TABS
5.0000 mg | ORAL_TABLET | Freq: Every day | ORAL | Status: DC
  Administered 2019-06-02: 5 mg via ORAL
  Filled 2019-06-02: qty 1

## 2019-06-02 MED ORDER — ASPIRIN 81 MG PO CHEW: 81.0000 mg | CHEWABLE_TABLET | Freq: Every day | ORAL | Status: DC

## 2019-06-02 SURGICAL SUPPLY — 8 items
CATH INFINITI 5 FR IM (CATHETERS) ×1 IMPLANT
CATH INFINITI 5FR MULTPACK ANG (CATHETERS) ×1 IMPLANT
KIT HEART LEFT (KITS) ×2 IMPLANT
PACK CARDIAC CATHETERIZATION (CUSTOM PROCEDURE TRAY) ×2 IMPLANT
SHEATH PINNACLE 5F 10CM (SHEATH) ×2 IMPLANT
TRANSDUCER W/STOPCOCK (MISCELLANEOUS) ×2 IMPLANT
TUBING CIL FLEX 10 FLL-RA (TUBING) ×2 IMPLANT
WIRE EMERALD 3MM-J .035X150CM (WIRE) ×2 IMPLANT

## 2019-06-02 NOTE — Progress Notes (Signed)
ANTICOAGULATION CONSULT NOTE - Follow Up Consult  Pharmacy Consult for heparin Indication: chest pain/ACS  Allergies  Allergen Reactions  . Mushroom Extract Complex Anaphylaxis  . 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 Iodinated Diagnostic Agents Hives  . Nsaids   . Lactose Diarrhea    Patient Measurements: Height: 5\' 8"  (172.7 cm) Weight: 113.9 kg (251 lb) IBW/kg (Calculated) : 63.9 Heparin Dosing Weight: 90kg  Vital Signs: Temp: 97.5 F (36.4 C) (04/05 0755) Temp Source: Oral (04/05 0755) BP: 124/71 (04/05 0932) Pulse Rate: 87 (04/05 0934)  Labs: Recent Labs    05/31/19 2324 06/01/19 0156 06/01/19 0537 06/01/19 0912 06/01/19 1354 06/01/19 1921 06/02/19 0531  HGB 10.4*  --   --   --   --   --  11.1*  HCT 35.3*  --   --   --   --   --  36.2  PLT 298  --   --   --   --   --  289  APTT  --   --   --  43*  --  49* 88*  HEPARINUNFRC  --   --   --  >2.20*  --   --  1.62*  CREATININE 1.51*  --  1.49*  --   --   --  1.57*  TROPONINIHS 402*   < > 6,277* 4,309* 4,271*  --   --    < > = values in this interval not displayed.    Estimated Creatinine Clearance: 53.6 mL/min (A) (by C-G formula based on SCr of 1.57 mg/dL (H)).   Medical History: Past Medical History:  Diagnosis Date  . GERD (gastroesophageal reflux disease)   . 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 Hypertension   . Iron deficiency anemia   . Restless leg syndrome   . Tachycardia   . Type 2 diabetes mellitus (HCC)     Assessment: 54yo female c/o CP and inappropriate defib firing, troponin elevated and rising 54yo), to begin heparin until able to get LHC. She was on Eliquis PTA with the last dose taken on 4/3 @ 2000.   Heparin level is falsely elevated due to recent Eliquis use. Daily aPTT now at goal (88  seconds) on 1700 units/hr.  Goal of Therapy:  Heparin level 0.3-0.7 units/ml aPTT 66-102 seconds Monitor platelets by anticoagulation protocol: Yes   Plan:  Continue heparin drip at 1700 units/hr  2001 PharmD., BCPS Clinical Pharmacist 06/02/2019 2:51 PM

## 2019-06-02 NOTE — H&P (View-Only) (Signed)
Progress Note  Patient Name: Rachel Rivers Date of Encounter: 06/02/2019  Primary Cardiologist: @ [facility]  Subjective   Feeling much better this AM, no CP, no SOB. Can lay flat. Reports excellent UOP following Lasix yesterday. Able to be weaned down from NRB --> HFNC -> 4L/[place].  Inpatient Medications    Scheduled Meds: . acidophilus  1 capsule Oral Daily  . amiodarone  200 mg Oral Daily  . aspirin EC  81 mg Oral Daily  . atorvastatin  80 mg Oral QHS  . diphenhydrAMINE  50 mg Oral On Call   Or  . diphenhydrAMINE  50 mg Intravenous On Call  . furosemide  20 mg Intravenous BID  . insulin aspart  0-5 Units Subcutaneous QHS  . insulin aspart  0-9 Units Subcutaneous TID WC  . insulin glargine  40 Units Subcutaneous Once  . [START ON 06/03/2019] insulin glargine  60 Units Subcutaneous Daily  . levothyroxine  100 mcg Oral Q0600  . metoprolol  200 mg Oral Daily  . multivitamin with minerals  1 tablet Oral Daily  . pantoprazole  40 mg Oral BID  . PARoxetine  10 mg Oral Daily  . sodium chloride flush  3 mL Intravenous Q12H  . sucralfate  1 g Oral QID   Continuous Infusions: . sodium chloride    . sodium chloride    . heparin 1,700 Units/hr (06/02/19 0900)  . nitroGLYCERIN 5 mcg/min (06/02/19 0900)   PRN Meds: sodium chloride, acetaminophen, nitroGLYCERIN, ondansetron (ZOFRAN) IV, polyethylene glycol, sodium chloride flush   Vital Signs    Vitals:   06/02/19 0353 06/02/19 0755 06/02/19 0932 06/02/19 0934  BP:  128/69 124/71   Pulse:  90  87  Resp:  14    Temp: 97.7 F (36.5 C) (!) 97.5 F (36.4 C)    TempSrc: Oral Oral    SpO2:  100%    Weight:      Height:        Intake/Output Summary (Last 24 hours) at 06/02/2019 1000 Last data filed at 06/02/2019 0900 Gross per 24 hour  Intake 970.23 ml  Output 2350 ml  Net -1379.77 ml   Last 3 Weights 06/02/2019 06/01/2019 05/31/2019  Weight (lbs) 251 lb 249 lb 3.2 oz 248 lb  Weight (kg) 113.853 kg 113.036 kg 112.492 kg      Telemetry    AV paced and NSR - Personally Reviewed  Physical Exam   GEN: No acute distress.  HEENT: Normocephalic, atraumatic, sclera non-icteric. Neck: No JVD or bruits. Cardiac: RRR no murmurs, rubs, or gallops.  Radials/DP/PT 1+ and equal bilaterally.  Respiratory: Coarse BS bilaterally but no wheezes or rhonchi. Breathing is unlabored. GI: Soft, nontender, non-distended, BS +x 4. MS: no deformity. Extremities: No clubbing or cyanosis. No overt pitting edema but sock indentations due to sock size/leg habitus mismatch. Distal pedal pulses are 2+ and equal bilaterally. Neuro:  AAOx3. Follows commands. Historically has chronic left hemiparesis Psych:  Responds to questions appropriately with a normal affect.  Labs    High Sensitivity Troponin:   Recent Labs  Lab 05/31/19 2324 06/01/19 0156 06/01/19 0537 06/01/19 0912 06/01/19 1354  TROPONINIHS 402* 3,603* 6,277* 4,309* 4,271*      Cardiac EnzymesNo results for input(s): TROPONINI in the last 168 hours. No results for input(s): TROPIPOC in the last 168 hours.   Chemistry Recent Labs  Lab 05/31/19 2324 06/01/19 0537 06/02/19 0531  NA 138 138 136  K 4.0 4.0 4.8  CL 103 99  95*  CO2 21* 26 28  GLUCOSE 148* 142* 335*  BUN 25* 28* 27*  CREATININE 1.51* 1.49* 1.57*  CALCIUM 8.2* 8.8* 8.7*  GFRNONAA 39* 39* 37*  GFRAA 45* 45* 43*  ANIONGAP Hematology Recent Labs  Lab 05/31/19 2324 06/02/19 0531  WBC 9.7 13.4*  RBC 3.85* 4.03  HGB 10.4* 11.1*  HCT 35.3* 36.2  MCV 91.7 89.8  MCH 27.0 27.5  MCHC 29.5* 30.7  RDW 17.8* 17.2*  PLT 298 289    BNPNo results for input(s): BNP, PROBNP in the last 168 hours.   DDimer No results for input(s): DDIMER in the last 168 hours.   Radiology    DG CHEST PORT 1 VIEW  Result Date: 06/01/2019 CLINICAL DATA:  Chest pain. Defibrillator. EXAM: PORTABLE CHEST 1 VIEW COMPARISON:  Chest x-ray dated 05/31/2019. FINDINGS: New patchy bilateral airspace opacities,  lower lobe predominant. No pleural effusion or pneumothorax is seen. Heart size and mediastinal contours are stable. LEFT chest wall pacemaker/ICD apparatus in place. IMPRESSION: New patchy bilateral airspace opacities, lower lobe predominant, most likely pulmonary edema. Pneumonia cannot be excluded if febrile. Electronically Signed   By: Bary Richard M.D.   On: 06/01/2019 13:59   DG Chest Port 1 View  Result Date: 05/31/2019 CLINICAL DATA:  Chest pain, STEMI, ventricular tachycardia, defibrillator discharge EXAM: PORTABLE CHEST 1 VIEW COMPARISON:  Portable exam 2344 hours without prior exams available for comparison FINDINGS: LEFT subclavian AICD with leads projecting over RIGHT atrium and RIGHT ventricle. Normal heart size, mediastinal contours, and pulmonary vascularity. Lungs clear. No infiltrate, pleural effusion, or pneumothorax. Osseous structures unremarkable. IMPRESSION: No acute abnormalities. Electronically Signed   By: Ulyses Southward M.D.   On: 05/31/2019 23:52   ECHOCARDIOGRAM COMPLETE  Result Date: 06/01/2019    ECHOCARDIOGRAM REPORT   Patient Name:   Rachel Rivers Date of Exam: 06/01/2019 Medical Rec #:  [id number]       Height:       68.0 in Accession #:    [id number]      Weight:       249.2 lb Date of Birth:  05/14/1965       BSA:          2.244 m Patient Age:    54 years        BP:           133/87 mmHg Patient Gender: F               HR:           90 bpm. Exam Location:  Inpatient Procedure: 2D Echo, Color Doppler and Cardiac Doppler Indications:    Ventricular tachycardia I47.2  History:        Patient has no prior history of Echocardiogram examinations.                 CAD, Prior CABG and Defibrillator; Risk Factors:Diabetes and                 Hypertension.  Sonographer:    Ross Ludwig RDCS (AE) Referring Phys: [id number] [facility]  Sonographer Comments: Suboptimal subcostal window and patient is morbidly obese. Image acquisition challenging due to patient body habitus. IMPRESSIONS  1.  Akinesis of the inferolateral wall with overall mild to moderate LV dysfunction; mild LVH; grade 2 diastolic dysfunction; mild MR; mild LAE; mild TR.  2. Left ventricular ejection fraction, by estimation, is 40 to 45%. The left ventricle has  mild to moderately decreased function. The left ventricle demonstrates regional wall motion abnormalities (see scoring diagram/findings for description). There is  mild left ventricular hypertrophy. Left ventricular diastolic parameters are consistent with Grade II diastolic dysfunction (pseudonormalization).  3. Right ventricular systolic function is normal. The right ventricular size is normal.  4. Left atrial size was mildly dilated.  5. The mitral valve is normal in structure. Mild mitral valve regurgitation. No evidence of mitral stenosis.  6. The aortic valve is tricuspid. Aortic valve regurgitation is not visualized. No aortic stenosis is present. FINDINGS  Left Ventricle: Left ventricular ejection fraction, by estimation, is 40 to 45%. The left ventricle has mild to moderately decreased function. The left ventricle demonstrates regional wall motion abnormalities. The left ventricular internal cavity size was normal in size. There is mild left ventricular hypertrophy. Left ventricular diastolic parameters are consistent with Grade II diastolic dysfunction (pseudonormalization). Right Ventricle: The right ventricular size is normal.Right ventricular systolic function is normal. Left Atrium: Left atrial size was mildly dilated. Right Atrium: Right atrial size was normal in size. Pericardium: There is no evidence of pericardial effusion. Mitral Valve: The mitral valve is normal in structure. Normal mobility of the mitral valve leaflets. Mild mitral annular calcification. Mild mitral valve regurgitation. No evidence of mitral valve stenosis. Tricuspid Valve: The tricuspid valve is normal in structure. Tricuspid valve regurgitation is mild . No evidence of tricuspid stenosis.  Aortic Valve: The aortic valve is tricuspid. Aortic valve regurgitation is not visualized. No aortic stenosis is present. Aortic valve mean gradient measures 4.0 mmHg. Aortic valve peak gradient measures 5.7 mmHg. Aortic valve area, by VTI measures 2.65 cm. Pulmonic Valve: The pulmonic valve was normal in structure. Pulmonic valve regurgitation is not visualized. No evidence of pulmonic stenosis. Aorta: The aortic root is normal in size and structure. Venous: The inferior vena cava was not well visualized. IAS/Shunts: No atrial level shunt detected by color flow Doppler. Additional Comments: Akinesis of the inferolateral wall with overall mild to moderate LV dysfunction; mild LVH; grade 2 diastolic dysfunction; mild MR; mild LAE; mild TR. A pacer wire is visualized.  LEFT VENTRICLE PLAX 2D LVIDd:         4.60 cm      Diastology LVIDs:         3.60 cm      LV e' lateral: 11.50 cm/s LV PW:         1.20 cm      LV e' medial:  6.64 cm/s LV IVS:        1.10 cm LVOT diam:     2.20 cm LV SV:         60 LV SV Index:   27 LVOT Area:     3.80 cm  LV Volumes (MOD) LV vol d, MOD A2C: 91.5 ml LV vol d, MOD A4C: 127.0 ml LV vol s, MOD A2C: 53.9 ml LV vol s, MOD A4C: 80.8 ml LV SV MOD A2C:     37.6 ml LV SV MOD A4C:     127.0 ml LV SV MOD BP:      41.8 ml RIGHT VENTRICLE RV Basal diam:  2.50 cm RV S prime:     5.98 cm/s TAPSE (M-mode): 1.3 cm LEFT ATRIUM             Index       RIGHT ATRIUM           Index LA diam:  4.30 cm 1.92 cm/m  RA Area:     13.20 cm LA Vol (A2C):   76.7 ml 34.18 ml/m RA Volume:   28.30 ml  12.61 ml/m LA Vol (A4C):   87.1 ml 38.81 ml/m LA Biplane Vol: 86.1 ml 38.37 ml/m  AORTIC VALVE AV Area (Vmax):    2.32 cm AV Area (Vmean):   2.36 cm AV Area (VTI):     2.65 cm AV Vmax:           119.00 cm/s AV Vmean:          90.200 cm/s AV VTI:            0.228 m AV Peak Grad:      5.7 mmHg AV Mean Grad:      4.0 mmHg LVOT Vmax:         72.60 cm/s LVOT Vmean:        55.900 cm/s LVOT VTI:          0.159 m  LVOT/AV VTI ratio: 0.70  AORTA Ao Root diam: 3.10 cm MR Peak grad:    87.6 mmHg   TRICUSPID VALVE MR Mean grad:    60.0 mmHg   TR Peak grad:   34.8 mmHg MR Vmax:         468.00 cm/s TR Vmax:        295.00 cm/s MR Vmean:        366.0 cm/s MR PISA:         3.08 cm    SHUNTS MR PISA Eff ROA: 20 mm      Systemic VTI:  0.16 m MR PISA Radius:  0.70 cm     Systemic Diam: 2.20 cm Olga Millers MD Electronically signed by Olga Millers MD Signature Date/Time: 06/01/2019/11:53:04 AM    Final     Cardiac Studies   Cath 05/08/17 - [facility] Findings: 1. Significant 3-vessel coronary artery disease. 2. Patent LIMA to LAD. 3. Patent SVG to diffusely diseased RCA.  4. Occluded SVG x2 to OM vessels.  5. Normal LV function; EF estimated at 55%.  6. Elevated left ventricular filling pressures (LVEDP 22 mm Hg).   Recommendations: 1. Aggressive secondary prevention. 2. Further management per inpatient cardiology team.   2D echo 04/2017 - [facility]  Left ventricular hypertrophy - mild  Mildly decreased left ventricular systolic function, ejection fraction  45 to 50%. No significant change compared to prior echo dated 03/05/2017.  Diastolic dysfunction - grade II (elevated filling pressures)  Dilated left atrium - mild  Mildly thickened mitral valve  Mitral regurgitation - mild  Aortic sclerosis  Normal right ventricular systolic function  Mildly elevated right atrial pressure  2D echo 06/01/19 1. Akinesis of the inferolateral wall with overall mild to moderate LV  dysfunction; mild LVH; grade 2 diastolic dysfunction; mild MR; mild LAE;  mild TR.  2. Left ventricular ejection fraction, by estimation, is 40 to 45%. The  left ventricle has mild to moderately decreased function. The left  ventricle demonstrates regional wall motion abnormalities (see scoring  diagram/findings for description). There is  mild left ventricular hypertrophy. Left ventricular diastolic parameters  are  consistent with Grade II diastolic dysfunction (pseudonormalization).  3. Right ventricular systolic function is normal. The right ventricular  size is normal.  4. Left atrial size was mildly dilated.  5. The mitral valve is normal in structure. Mild mitral valve  regurgitation. No evidence of mitral stenosis.  6. The aortic valve is tricuspid. Aortic valve regurgitation  is not  visualized. No aortic stenosis is present.   Patient Profile     54 y.o. 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 Per outside note from 10/2018 she has history of ICD shock 07/2018 for atrial tachycardia and sinus tachycardia with LBBB in context of viral illness. They indicate she is on Eliquis for prior history of stroke. Per EP note 06/01/19 from Dr. Elberta Fortis, ICD interrogation showed that ICD shocks were inappropriate from atrial arrhythmias so changes were made to programming to avoid inappropriate shock. Given elevated troponin, cardiac cath recommended to evaluate for further coronary occlusions.  Assessment & Plan    1. ICD shocks due to atrial arrhythmias - I spoke with Dr. Elberta Fortis today and he reports it was a 1:1 atrial arrhythmia, likely atrial tach. Device reprogrammed to avoid inappropriate therapies. He recommends continuing prior home regimen which includes amiodarone 200mg  and metoprolol 200mg  daily, and follow-up with primary cardiologist. TSH abnormal as below. Will also update LFTs.  2. Elevated troponin/possible NSTEMI in context of history of CAD - peak troponin 6277 - Dr. recommended LHC today to assess for coronary occlusion as inciting event. Risks  and benefits of cardiac catheterization have been discussed with the patient.  These include bleeding, infection, kidney damage, stroke, heart attack, death.  The patient understands these risks and is willing to proceed. She is being premedicated for dye allergy. IM is adjusting insulin. Need to avoid LV gram given renal insufficiency - have communicated this to pre-cath nurse to relay to cathing team. Check lipids/LFTs in AM.  3. Acute on chronic combined CHF - EF 40-45% with akinesis of the inferolateral wall, grade 2 DD, normal RV, mild MR. Developed chest pain, hypoxia requiring NRB, and pulmonary edema on CXR yesterday requiring IV Lasix. Urinated 2.3L out, for net of - . Symptomatically improved. Hold off further IV Lasix given cath and AKI - given pulmonary edema will also hold off fluids as well. D/w Dr. Elberta Fortis.  4. AKI on CKD stage III - prior Cr noted in [facility] of 1.05-1.30. This admission, 1.51->1.47->1.57. Lisinopril and HCTZ on hold. Need to follow cautiously. Would hold off resuming ACEI/ARB/ARNI until we can see what post-cath creatinine does.  5. History of stroke - per outside cardiology notes, on Eliquis for this. Chronic left hemiparesis reported. Do not see overt hx of atrial fib/flutter. Eliquis on hold and now on heparin.  6. Abnormal thyroid function  - TSH elevated at 7.791. Further management per IM. Needs close f/u given amiodarone.  7. Hypomagnesmia - Mg 1.4 on arrival, repleted and now maintaining at normal level.  For questions or updates, please contact [facility] Please consult [email or web address] for contact info under Cardiology/STEMI.  Signed, , PA-C 06/02/2019, 10:00 AM    Patient seen and examined with Laurann Montana PA-C.  Agree as above, with the following exceptions and changes as noted below.Feels well, will be going for cath shortly, no questions or concerns about the procedure. Gen: NAD, CV: RRR, no murmurs, left chest wall generator  pocket has no erythema, edema, well-healed incision lungs: clear, Abd: soft, Extrem: Warm, well perfused, diffuse edema, Neuro/Psych: alert and oriented x 3, normal mood and  affect. All available labs, radiology testing, previous records reviewed.  Due to elevated troponin will go for coronary angiography today.  Device settings have been optimized per Dr. Curt Bears and medical therapy as well.  Elouise Munroe 06/02/19 11:36 AM

## 2019-06-02 NOTE — Progress Notes (Signed)
Progress Note    Rachel Rivers  EAV:[id number] DOB: 03/11/1965  DOA: 05/31/2019 PCP: Selinda Flavin, MD    Brief Narrative:   Chief complaint: chest pain/inappropriate shocks from ICD  Medical records reviewed and are as summarized below:  Rachel Rivers is an 54 y.o. female with a past medical history significant for CAD status post multiple PCI's and CABG, VT on amiodarone with ICD, hypertension, CVA, diabetes admitted April 4 for NSTEMI and acute kidney injury.  She presented to the emergency department for evaluation of defibrillator firing.  She reported receiving multiple shocks from her ICD.  She denied having any chest pain initially after several shocks she developed substernal nonradiating pressure-like chest pain associated with diaphoresis and nausea.  Was evaluated by cardiology who recommended treating as NSTEMI with cycling troponins and repeating echocardiogram.  Ultimately recommended cath today.  ICD interrogation revealed inappropriate shocks for sinus tachycardia.  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  Elevated troponin/possible NSTEMI.  History of CAD. Troponin 402>>3603>>6277>>4309>>4271.  Scheduled for left heart cath today to assess for coronary occlusion as inciting event.  Currently is chest pain-free.  No events on telemetry. -Continue heparin drip per pharmacy -Further management per cardiology  #2.  ICD shocks due to atrial arrhythmias.  Device interrogated yesterday.  Cardiology evaluated and opined likely atrial tachycardia.  Chart indicates device was  reprogrammed to avoid inappropriate therapies.  Neurology recommends continuing prior home regimen pacifically amiodarone and metoprolol with close outpatient follow-up with primary cardiologist.  -Follow lipid panel  #3.  Acute on chronic combined CHF.  EF with 40 to 45% with akinesis of the inferior lateral wall grade 2 diastolic dysfunction, yesterday developed chest pain and pulmonary edema on chest x-ray.  She was provided with IV Lasix.  I's and O's indicate volume status -981 mls.  Evaluated by cardiology who recommend holding off on further IV Lasix given her cath and acute kidney injury. -Stop IV fluids -Monitor intake and output -Obtain daily weights  #4.  Acute kidney injury superimposed on chronic kidney disease stage III.  Creatinine 1.57  this morning which is trending upward.  Likely related to the IV Lasix she got yesterday. -Continue to hold nephrotoxins as able -Monitor urine output -Recheck in the morning  #5.  Abnormal TSH. Suspect non-compliance.  TSH January 2021 was 1.7.  Home medications include Synthroid -continue home med.  #6.  Hypomagnesemia.  Repleted and resolved -Monitor to keep 2.0 or greater  #7.  Diabetes type 2.  Uncontrolled.  Hemoglobin A1c 6 9.62 months ago. -Sliding scale -Continue home long-acting insulin -Follow hemoglobin A1c  #8.  Hypertension.  Fair control.  Home medications include Lasix, hydrochlorothiazide, quinapril, amiodarone, metoprolol -Holding Lasix and hydrochlorothiazide for now.  Of note she did receive 1 dose of IV Lasix yesterday for pulmonary edema -Continue metoprolol -Continue amiodarone    Family Communication/Anticipated D/C date and plan/Code Status   DVT prophylaxis: heparin gtt Code Status: Full Code.  Family Communication: patient Disposition Plan: home once cleared by cardiology and    Medical Consultants:    carediology   Anti-Infectives:    None  Subjective:   Alert.  Denies chest pain but does  complain of chronic leg and low back pain.  Also complains of her n.p.o. status and the fact that she does not know what time she is having her cath  Objective:    Vitals:   06/02/19 0353 06/02/19 0755 06/02/19 0932 06/02/19 0934  BP:  128/69 124/71   Pulse:  90  87  Resp:  14    Temp: 97.7 F (36.5 C) (!) 97.5 F (36.4 C)    TempSrc: Oral Oral    SpO2:  100%    Weight:      Height:        Intake/Output Summary (Last 24 hours) at 06/02/2019 1250 Last data filed at 06/02/2019 0900 Gross per 24 hour  Intake 690.4 ml  Output 2100 ml  Net -1409.6 ml   Filed Weights   05/31/19 2311 06/01/19 0654 06/02/19 0350  Weight: 112.5 kg 113 kg 113.9 kg    Exam: General: Awake alert no acute distress CV: Regular rate and rhythm no murmur gallop or rub trace lower extremity edema bilaterally Respiratory: Mild increased work of breathing with conversation.  Breath sounds are slightly distant with fine crackles bilateral bases I hear no wheeze Abdomen: Obese soft positive bowel sounds throughout nontender to palpation no mass organomegaly noted Musculoskeletal: Joints without swelling/erythema full range of motion Neuro: Alert and oriented x3 speech clear facial symmetry  Data Reviewed:   I have personally reviewed following labs and imaging studies:  Labs: Labs show the following:   Basic Metabolic Panel: Recent Labs  Lab 05/31/19 2324 05/31/19 2324 06/01/19 0537 06/01/19 1921 06/02/19 0531  NA 138  --  138  --  136  K 4.0   < > 4.0  --  4.8  CL 103  --  99  --  95*  CO2 21*  --  26  --  28  GLUCOSE 148*  --  142*  --  335*  BUN 25*  --  28*  --  27*  CREATININE 1.51*  --  1.49*  --  1.57*  CALCIUM 8.2*  --  8.8*  --  8.7*  MG  --   --  1.4* 2.1 2.0   < > = values in this interval not displayed.   GFR Estimated Creatinine Clearance: 53.6 mL/min (A) (by C-G formula based on SCr of 1.57 mg/dL (H)). Liver Function Tests: No results for input(s): AST, ALT, ALKPHOS, BILITOT,  PROT, ALBUMIN in the last 168 hours. No results for input(s): LIPASE, AMYLASE in the last 168 hours. No results for input(s): AMMONIA in the last 168 hours. Coagulation profile No results for input(s): INR, PROTIME in the last 168 hours.  CBC: Recent Labs  Lab 05/31/19 2324 06/02/19 0531  WBC 9.7 13.4*  NEUTROABS 7.6  --   HGB 10.4* 11.1*  HCT 35.3* 36.2  MCV 91.7 89.8  PLT 298 289   Cardiac Enzymes: No results for input(s): CKTOTAL, CKMB, CKMBINDEX, TROPONINI in the last 168 hours. BNP (last 3 results) No results for input(s): PROBNP in the last 8760 hours. CBG: Recent Labs  Lab 06/01/19 1129 06/01/19 1639 06/01/19 2158 06/02/19 0751 06/02/19 1201  GLUCAP 223* 207* 148* 307* 324*   D-Dimer: No results for input(s): DDIMER in the last 72 hours. Hgb A1c: Recent Labs    06/01/19 0537  HGBA1C 8.4*   Lipid Profile: No results for input(s): CHOL, HDL, LDLCALC, TRIG, CHOLHDL, LDLDIRECT in the last 72 hours. Thyroid  function studies: Recent Labs    05/31/19 2349  TSH 7.791*   Anemia work up: No results for input(s): VITAMINB12, FOLATE, FERRITIN, TIBC, IRON, RETICCTPCT in the last 72 hours. Sepsis Labs: Recent Labs  Lab 05/31/19 2324 06/02/19 0531  WBC 9.7 13.4*    Microbiology Recent Results (from the past 240 hour(s))  SARS CORONAVIRUS 2 (TAT 6-24 HRS) Nasopharyngeal Nasopharyngeal Swab     Status: None   Collection Time: 06/01/19  4:54 AM   Specimen: Nasopharyngeal Swab  Result Value Ref Range Status   SARS Coronavirus 2 NEGATIVE NEGATIVE Final    Comment: (NOTE) SARS-CoV-2 target nucleic acids are NOT DETECTED. The SARS-CoV-2 RNA is generally detectable in upper and lower respiratory specimens during the acute phase of infection. Negative results do not preclude SARS-CoV-2 infection, do not rule out co-infections with other pathogens, and should not be used as the sole basis for treatment or other patient management decisions. Negative results must  be combined with clinical observations, patient history, and epidemiological information. The expected result is Negative. Fact Sheet for Patients: SugarRoll.be Fact Sheet for Healthcare Providers: https://www.woods-mathews.com/ This test is not yet approved or cleared by the [place] FDA and  has been authorized for detection and/or diagnosis of SARS-CoV-2 by FDA under an Emergency Use Authorization (EUA). This EUA will remain  in effect (meaning this test can be used) for the duration of the COVID-19 declaration under Section 56 4(b)(1) of the Act, 21 U.S.C. section 360bbb-3(b)(1), unless the authorization is terminated or revoked sooner. Performed at [facility], [place] [address] [place], [place] [address]     Procedures and diagnostic studies:  DG CHEST PORT 1 VIEW  Result Date: 06/01/2019 CLINICAL DATA:  Chest pain. Defibrillator. EXAM: PORTABLE CHEST 1 VIEW COMPARISON:  Chest x-ray dated 05/31/2019. FINDINGS: New patchy bilateral airspace opacities, lower lobe predominant. No pleural effusion or pneumothorax is seen. Heart size and mediastinal contours are stable. LEFT chest wall pacemaker/ICD apparatus in place. IMPRESSION: New patchy bilateral airspace opacities, lower lobe predominant, most likely pulmonary edema. Pneumonia cannot be excluded if febrile. Electronically Signed   By: Franki Cabot M.D.   On: 06/01/2019 13:59   DG Chest Port 1 View  Result Date: 05/31/2019 CLINICAL DATA:  Chest pain, STEMI, ventricular tachycardia, defibrillator discharge EXAM: PORTABLE CHEST 1 VIEW COMPARISON:  Portable exam 2344 hours without prior exams available for comparison FINDINGS: LEFT subclavian AICD with leads projecting over RIGHT atrium and RIGHT ventricle. Normal heart size, mediastinal contours, and pulmonary vascularity. Lungs clear. No infiltrate, pleural effusion, or pneumothorax. Osseous structures unremarkable. IMPRESSION: No acute  abnormalities. Electronically Signed   By: Lavonia Dana M.D.   On: 05/31/2019 23:52   ECHOCARDIOGRAM COMPLETE  Result Date: 06/01/2019    ECHOCARDIOGRAM REPORT   Patient Name:   Rachel Rivers Date of Exam: 06/01/2019 Medical Rec #:  [id number]       Height:       68.0 in Accession #:    [id number]      Weight:       249.2 lb Date of Birth:  03/26/1965       BSA:          2.244 m Patient Age:    3 years        BP:           133/87 mmHg Patient Gender: F               HR:  90 bpm. Exam Location:  Inpatient Procedure: 2D Echo, Color Doppler and Cardiac Doppler Indications:    Ventricular tachycardia I47.2  History:        Patient has no prior history of Echocardiogram examinations.                 CAD, Prior CABG and Defibrillator; Risk Factors:Diabetes and                 Hypertension.  Sonographer:    Ross Ludwig RDCS (AE) Referring Phys: [id number] [facility]  Sonographer Comments: Suboptimal subcostal window and patient is morbidly obese. Image acquisition challenging due to patient body habitus. IMPRESSIONS  1. Akinesis of the inferolateral wall with overall mild to moderate LV dysfunction; mild LVH; grade 2 diastolic dysfunction; mild MR; mild LAE; mild TR.  2. Left ventricular ejection fraction, by estimation, is 40 to 45%. The left ventricle has mild to moderately decreased function. The left ventricle demonstrates regional wall motion abnormalities (see scoring diagram/findings for description). There is  mild left ventricular hypertrophy. Left ventricular diastolic parameters are consistent with Grade II diastolic dysfunction (pseudonormalization).  3. Right ventricular systolic function is normal. The right ventricular size is normal.  4. Left atrial size was mildly dilated.  5. The mitral valve is normal in structure. Mild mitral valve regurgitation. No evidence of mitral stenosis.  6. The aortic valve is tricuspid. Aortic valve regurgitation is not visualized. No aortic stenosis is present.  FINDINGS  Left Ventricle: Left ventricular ejection fraction, by estimation, is 40 to 45%. The left ventricle has mild to moderately decreased function. The left ventricle demonstrates regional wall motion abnormalities. The left ventricular internal cavity size was normal in size. There is mild left ventricular hypertrophy. Left ventricular diastolic parameters are consistent with Grade II diastolic dysfunction (pseudonormalization). Right Ventricle: The right ventricular size is normal.Right ventricular systolic function is normal. Left Atrium: Left atrial size was mildly dilated. Right Atrium: Right atrial size was normal in size. Pericardium: There is no evidence of pericardial effusion. Mitral Valve: The mitral valve is normal in structure. Normal mobility of the mitral valve leaflets. Mild mitral annular calcification. Mild mitral valve regurgitation. No evidence of mitral valve stenosis. Tricuspid Valve: The tricuspid valve is normal in structure. Tricuspid valve regurgitation is mild . No evidence of tricuspid stenosis. Aortic Valve: The aortic valve is tricuspid. Aortic valve regurgitation is not visualized. No aortic stenosis is present. Aortic valve mean gradient measures 4.0 mmHg. Aortic valve peak gradient measures 5.7 mmHg. Aortic valve area, by VTI measures 2.65 cm. Pulmonic Valve: The pulmonic valve was normal in structure. Pulmonic valve regurgitation is not visualized. No evidence of pulmonic stenosis. Aorta: The aortic root is normal in size and structure. Venous: The inferior vena cava was not well visualized. IAS/Shunts: No atrial level shunt detected by color flow Doppler. Additional Comments: Akinesis of the inferolateral wall with overall mild to moderate LV dysfunction; mild LVH; grade 2 diastolic dysfunction; mild MR; mild LAE; mild TR. A pacer wire is visualized.  LEFT VENTRICLE PLAX 2D LVIDd:         4.60 cm      Diastology LVIDs:         3.60 cm      LV e' lateral: 11.50 cm/s LV PW:          1.20 cm      LV e' medial:  6.64 cm/s LV IVS:        1.10 cm LVOT diam:  2.20 cm LV SV:         60 LV SV Index:   27 LVOT Area:     3.80 cm  LV Volumes (MOD) LV vol d, MOD A2C: 91.5 ml LV vol d, MOD A4C: 127.0 ml LV vol s, MOD A2C: 53.9 ml LV vol s, MOD A4C: 80.8 ml LV SV MOD A2C:     37.6 ml LV SV MOD A4C:     127.0 ml LV SV MOD BP:      41.8 ml RIGHT VENTRICLE RV Basal diam:  2.50 cm RV S prime:     5.98 cm/s TAPSE (M-mode): 1.3 cm LEFT ATRIUM             Index       RIGHT ATRIUM           Index LA diam:        4.30 cm 1.92 cm/m  RA Area:     13.20 cm LA Vol (A2C):   76.7 ml 34.18 ml/m RA Volume:   28.30 ml  12.61 ml/m LA Vol (A4C):   87.1 ml 38.81 ml/m LA Biplane Vol: 86.1 ml 38.37 ml/m  AORTIC VALVE AV Area (Vmax):    2.32 cm AV Area (Vmean):   2.36 cm AV Area (VTI):     2.65 cm AV Vmax:           119.00 cm/s AV Vmean:          90.200 cm/s AV VTI:            0.228 m AV Peak Grad:      5.7 mmHg AV Mean Grad:      4.0 mmHg LVOT Vmax:         72.60 cm/s LVOT Vmean:        55.900 cm/s LVOT VTI:          0.159 m LVOT/AV VTI ratio: 0.70  AORTA Ao Root diam: 3.10 cm MR Peak grad:    87.6 mmHg   TRICUSPID VALVE MR Mean grad:    60.0 mmHg   TR Peak grad:   34.8 mmHg MR Vmax:         468.00 cm/s TR Vmax:        295.00 cm/s MR Vmean:        366.0 cm/s MR PISA:         3.08 cm    SHUNTS MR PISA Eff ROA: 20 mm      Systemic VTI:  0.16 m MR PISA Radius:  0.70 cm     Systemic Diam: 2.20 cm Olga Millers MD Electronically signed by Olga Millers MD Signature Date/Time: 06/01/2019/11:53:04 AM    Final     Medications:   . acidophilus  1 capsule Oral Daily  . amiodarone  200 mg Oral Daily  . aspirin EC  81 mg Oral Daily  . atorvastatin  80 mg Oral QHS  . diphenhydrAMINE  50 mg Oral On Call   Or  . diphenhydrAMINE  50 mg Intravenous On Call  . insulin aspart  0-5 Units Subcutaneous QHS  . insulin aspart  0-9 Units Subcutaneous TID WC  . [START ON 06/03/2019] insulin glargine  60 Units Subcutaneous  Daily  . levothyroxine  100 mcg Oral Q0600  . metoprolol  200 mg Oral Daily  . multivitamin with minerals  1 tablet Oral Daily  . pantoprazole  40 mg Oral BID  . PARoxetine  10 mg Oral Daily  . sodium  chloride flush  3 mL Intravenous Q12H  . sucralfate  1 g Oral QID   Continuous Infusions: . sodium chloride    . sodium chloride    . heparin 1,700 Units/hr (06/02/19 1054)  . nitroGLYCERIN 5 mcg/min (06/02/19 0900)     LOS: 1 day   Gwenyth Bender NP  [facility]   How to contact the [facility] Attending or Consulting provider 7A - 7P or covering provider during after hours 7P -7A, for this patient?  1. Check the care team in [facility] and look for a) attending/consulting TR[facility] provider listed and b) the [facility] team listed 2. Log into [email or web address] and use [place]'s universal password to access. If you do not have the password, please contact the hospital operator. 3. Locate the [facility] provider you are looking for under [facility] and page to a number that you can be directly reached. 4. If you still have difficulty reaching the provider, please page the [facility] (Director on Call) for the Hospitalists listed on amion for assistance.  06/02/2019, 12:50 PM

## 2019-06-02 NOTE — Progress Notes (Addendum)
Inpatient Diabetes Program Recommendations  AACE/ADA: New Consensus Statement on Inpatient Glycemic Control (2015)  Target Ranges:  Prepandial:   less than 140 mg/dL      Peak postprandial:   less than 180 mg/dL (1-2 hours)      Critically ill patients:  140 - 180 mg/dL   Results for Rachel Rivers, Rachel Rivers (MRN [id number]) as of 06/02/2019 09:38  Ref. Range 06/01/2019 08:07 06/01/2019 11:29 06/01/2019 16:39 06/01/2019 21:58  Glucose-Capillary Latest Ref Range: 70 - 99 mg/dL 132 (H)  2 units NOVOLOG  223 (H)  3 units NOVOLOG +  60 units LANTUS  207 (H)  3 units NOVOLOG  148 (H)   50 mg Prednisone given at 9:58pm  50 mg Prednisone given at 1:51am   Results for Rachel Rivers, Rachel Rivers (MRN [id number]) as of 06/02/2019 09:38  Ref. Range 06/02/2019 07:51  Glucose-Capillary Latest Ref Range: 70 - 99 mg/dL 366 (H)  7 units NOVOLOG  50 mg Prednisone given at 8:13am    Results for Rachel Rivers, Rachel Rivers (MRN [id number]) as of 06/02/2019 09:38  Ref. Range 06/01/2019 05:37  Hemoglobin A1C Latest Ref Range: 4.8 - 5.6 % 8.4 (H)  (194 mg/dl)    Admit with: Inappropriate shocks from ICD/possible NSTEMI  History: DM, CVA, VT with ICD  Home DM Meds: Metformin 1000 mg BID       Novolog 26 units TID with meals       Novolog 2 units for every 50 mg/dl above CBG of 956       Toujeo 60 units Daily  Current Orders: Lantus 60 units Daily      Novolog Sensitive Correction Scale/ SSI (0-9 units) TID AC + HS    Endocrinologist: Dr. Crista Curb with [facility]--last seen 04/17/2019--Home Novolog dose was increased from 20 to 26 units TID with meals at that visit.  Note CBG 307 this AM--Likely due to patient receiving 3 doses of Prednisone (50 mg each) last PM and early this AM.  Note Prednisone was d/c'd.    Note patient will get reduced dose of Lantus this AM due to her being NPO for Cardiac Cath.   MD- When patient resumes her PO diet today, please also consider adding a portion of her home dose of Novolog as Meal  Coverage dose:  Novolog 6 units TID with meals (25% total home dose)     --Will follow patient during hospitalization--  Ambrose Finland RN, MSN, CDE Diabetes Coordinator Inpatient Glycemic Control Team Team Pager: [phone] (8a-5p)

## 2019-06-02 NOTE — Progress Notes (Signed)
Patient returned from cath lab at 1805hrs.  Right femoral site, level zero.  Clarified IVF orders with Dr. Tresa Endo. Patient given post cath instructions, verbalized understanding.

## 2019-06-02 NOTE — Progress Notes (Signed)
5Fr right femoral artery sheath aspirated and removed.  Manual pressure held by Griffith Citron, RN for 20 minutes, hemostasis achieved.  Site level 0. Tegaderm dressing applied to site.  Instructions given to pt, pt verbalizes understanding.     Bedrest begins 18:50.

## 2019-06-02 NOTE — Progress Notes (Addendum)
Progress Note  Patient Name: Maisee Vollman Date of Encounter: 06/02/2019  Primary Cardiologist: @ [facility]  Subjective   Feeling much better this AM, no CP, no SOB. Can lay flat. Reports excellent UOP following Lasix yesterday. Able to be weaned down from NRB --> HFNC -> 4L/[place].  Inpatient Medications    Scheduled Meds: . acidophilus  1 capsule Oral Daily  . amiodarone  200 mg Oral Daily  . aspirin EC  81 mg Oral Daily  . atorvastatin  80 mg Oral QHS  . diphenhydrAMINE  50 mg Oral On Call   Or  . diphenhydrAMINE  50 mg Intravenous On Call  . furosemide  20 mg Intravenous BID  . insulin aspart  0-5 Units Subcutaneous QHS  . insulin aspart  0-9 Units Subcutaneous TID WC  . insulin glargine  40 Units Subcutaneous Once  . [START ON 06/03/2019] insulin glargine  60 Units Subcutaneous Daily  . levothyroxine  100 mcg Oral Q0600  . metoprolol  200 mg Oral Daily  . multivitamin with minerals  1 tablet Oral Daily  . pantoprazole  40 mg Oral BID  . PARoxetine  10 mg Oral Daily  . sodium chloride flush  3 mL Intravenous Q12H  . sucralfate  1 g Oral QID   Continuous Infusions: . sodium chloride    . sodium chloride    . heparin 1,700 Units/hr (06/02/19 0900)  . nitroGLYCERIN 5 mcg/min (06/02/19 0900)   PRN Meds: sodium chloride, acetaminophen, nitroGLYCERIN, ondansetron (ZOFRAN) IV, polyethylene glycol, sodium chloride flush   Vital Signs    Vitals:   06/02/19 0353 06/02/19 0755 06/02/19 0932 06/02/19 0934  BP:  128/69 124/71   Pulse:  90  87  Resp:  14    Temp: 97.7 F (36.5 C) (!) 97.5 F (36.4 C)    TempSrc: Oral Oral    SpO2:  100%    Weight:      Height:        Intake/Output Summary (Last 24 hours) at 06/02/2019 1000 Last data filed at 06/02/2019 0900 Gross per 24 hour  Intake 970.23 ml  Output 2350 ml  Net -1379.77 ml   Last 3 Weights 06/02/2019 06/01/2019 05/31/2019  Weight (lbs) 251 lb 249 lb 3.2 oz 248 lb  Weight (kg) 113.853 kg 113.036 kg 112.492 kg      Telemetry    AV paced and NSR - Personally Reviewed  Physical Exam   GEN: No acute distress.  HEENT: Normocephalic, atraumatic, sclera non-icteric. Neck: No JVD or bruits. Cardiac: RRR no murmurs, rubs, or gallops.  Radials/DP/PT 1+ and equal bilaterally.  Respiratory: Coarse BS bilaterally but no wheezes or rhonchi. Breathing is unlabored. GI: Soft, nontender, non-distended, BS +x 4. MS: no deformity. Extremities: No clubbing or cyanosis. No overt pitting edema but sock indentations due to sock size/leg habitus mismatch. Distal pedal pulses are 2+ and equal bilaterally. Neuro:  AAOx3. Follows commands. Historically has chronic left hemiparesis Psych:  Responds to questions appropriately with a normal affect.  Labs    High Sensitivity Troponin:   Recent Labs  Lab 05/31/19 2324 06/01/19 0156 06/01/19 0537 06/01/19 0912 06/01/19 1354  TROPONINIHS 402* 3,603* 6,277* 4,309* 4,271*      Cardiac EnzymesNo results for input(s): TROPONINI in the last 168 hours. No results for input(s): TROPIPOC in the last 168 hours.   Chemistry Recent Labs  Lab 05/31/19 2324 06/01/19 0537 06/02/19 0531  NA 138 138 136  K 4.0 4.0 4.8  CL 103 99  95*  CO2 21* 26 28  GLUCOSE 148* 142* 335*  BUN 25* 28* 27*  CREATININE 1.51* 1.49* 1.57*  CALCIUM 8.2* 8.8* 8.7*  GFRNONAA 39* 39* 37*  GFRAA 45* 45* 43*  ANIONGAP 14 13 13     Hematology Recent Labs  Lab 05/31/19 2324 06/02/19 0531  WBC 9.7 13.4*  RBC 3.85* 4.03  HGB 10.4* 11.1*  HCT 35.3* 36.2  MCV 91.7 89.8  MCH 27.0 27.5  MCHC 29.5* 30.7  RDW 17.8* 17.2*  PLT 298 289    BNPNo results for input(s): BNP, PROBNP in the last 168 hours.   DDimer No results for input(s): DDIMER in the last 168 hours.   Radiology    DG CHEST PORT 1 VIEW  Result Date: 06/01/2019 CLINICAL DATA:  Chest pain. Defibrillator. EXAM: PORTABLE CHEST 1 VIEW COMPARISON:  Chest x-ray dated 05/31/2019. FINDINGS: New patchy bilateral airspace opacities,  lower lobe predominant. No pleural effusion or pneumothorax is seen. Heart size and mediastinal contours are stable. LEFT chest wall pacemaker/ICD apparatus in place. IMPRESSION: New patchy bilateral airspace opacities, lower lobe predominant, most likely pulmonary edema. Pneumonia cannot be excluded if febrile. Electronically Signed   By: Stan  Maynard M.D.   On: 06/01/2019 13:59   DG Chest Port 1 View  Result Date: 05/31/2019 CLINICAL DATA:  Chest pain, STEMI, ventricular tachycardia, defibrillator discharge EXAM: PORTABLE CHEST 1 VIEW COMPARISON:  Portable exam 2344 hours without prior exams available for comparison FINDINGS: LEFT subclavian AICD with leads projecting over RIGHT atrium and RIGHT ventricle. Normal heart size, mediastinal contours, and pulmonary vascularity. Lungs clear. No infiltrate, pleural effusion, or pneumothorax. Osseous structures unremarkable. IMPRESSION: No acute abnormalities. Electronically Signed   By: Mark  Boles M.D.   On: 05/31/2019 23:52   ECHOCARDIOGRAM COMPLETE  Result Date: 06/01/2019    ECHOCARDIOGRAM REPORT   Patient Name:   Koni Turay Date of Exam: 06/01/2019 Medical Rec #:  [id number]       Height:       68.0 in Accession #:    [id number]      Weight:       249.2 lb Date of Birth:  06/15/1965       BSA:          2.244 m Patient Age:    54 years        BP:           133/87 mmHg Patient Gender: F               HR:           90 bpm. Exam Location:  Inpatient Procedure: 2D Echo, Color Doppler and Cardiac Doppler Indications:    Ventricular tachycardia I47.2  History:        Patient has no prior history of Echocardiogram examinations.                 CAD, Prior CABG and Defibrillator; Risk Factors:Diabetes and                 Hypertension.  Sonographer:    Arthur Guy RDCS (AE) Referring Phys: [id number] VASUNDHRA RATHORE  Sonographer Comments: Suboptimal subcostal window and patient is morbidly obese. Image acquisition challenging due to patient body habitus. IMPRESSIONS  1.  Akinesis of the inferolateral wall with overall mild to moderate LV dysfunction; mild LVH; grade 2 diastolic dysfunction; mild MR; mild LAE; mild TR.  2. Left ventricular ejection fraction, by estimation, is 40 to 45%. The left ventricle has   mild to moderately decreased function. The left ventricle demonstrates regional wall motion abnormalities (see scoring diagram/findings for description). There is  mild left ventricular hypertrophy. Left ventricular diastolic parameters are consistent with Grade II diastolic dysfunction (pseudonormalization).  3. Right ventricular systolic function is normal. The right ventricular size is normal.  4. Left atrial size was mildly dilated.  5. The mitral valve is normal in structure. Mild mitral valve regurgitation. No evidence of mitral stenosis.  6. The aortic valve is tricuspid. Aortic valve regurgitation is not visualized. No aortic stenosis is present. FINDINGS  Left Ventricle: Left ventricular ejection fraction, by estimation, is 40 to 45%. The left ventricle has mild to moderately decreased function. The left ventricle demonstrates regional wall motion abnormalities. The left ventricular internal cavity size was normal in size. There is mild left ventricular hypertrophy. Left ventricular diastolic parameters are consistent with Grade II diastolic dysfunction (pseudonormalization). Right Ventricle: The right ventricular size is normal.Right ventricular systolic function is normal. Left Atrium: Left atrial size was mildly dilated. Right Atrium: Right atrial size was normal in size. Pericardium: There is no evidence of pericardial effusion. Mitral Valve: The mitral valve is normal in structure. Normal mobility of the mitral valve leaflets. Mild mitral annular calcification. Mild mitral valve regurgitation. No evidence of mitral valve stenosis. Tricuspid Valve: The tricuspid valve is normal in structure. Tricuspid valve regurgitation is mild . No evidence of tricuspid stenosis.  Aortic Valve: The aortic valve is tricuspid. Aortic valve regurgitation is not visualized. No aortic stenosis is present. Aortic valve mean gradient measures 4.0 mmHg. Aortic valve peak gradient measures 5.7 mmHg. Aortic valve area, by VTI measures 2.65 cm. Pulmonic Valve: The pulmonic valve was normal in structure. Pulmonic valve regurgitation is not visualized. No evidence of pulmonic stenosis. Aorta: The aortic root is normal in size and structure. Venous: The inferior vena cava was not well visualized. IAS/Shunts: No atrial level shunt detected by color flow Doppler. Additional Comments: Akinesis of the inferolateral wall with overall mild to moderate LV dysfunction; mild LVH; grade 2 diastolic dysfunction; mild MR; mild LAE; mild TR. A pacer wire is visualized.  LEFT VENTRICLE PLAX 2D LVIDd:         4.60 cm      Diastology LVIDs:         3.60 cm      LV e' lateral: 11.50 cm/s LV PW:         1.20 cm      LV e' medial:  6.64 cm/s LV IVS:        1.10 cm LVOT diam:     2.20 cm LV SV:         60 LV SV Index:   27 LVOT Area:     3.80 cm  LV Volumes (MOD) LV vol d, MOD A2C: 91.5 ml LV vol d, MOD A4C: 127.0 ml LV vol s, MOD A2C: 53.9 ml LV vol s, MOD A4C: 80.8 ml LV SV MOD A2C:     37.6 ml LV SV MOD A4C:     127.0 ml LV SV MOD BP:      41.8 ml RIGHT VENTRICLE RV Basal diam:  2.50 cm RV S prime:     5.98 cm/s TAPSE (M-mode): 1.3 cm LEFT ATRIUM             Index       RIGHT ATRIUM           Index LA diam:  4.30 cm 1.92 cm/m  RA Area:     13.20 cm LA Vol (A2C):   76.7 ml 34.18 ml/m RA Volume:   28.30 ml  12.61 ml/m LA Vol (A4C):   87.1 ml 38.81 ml/m LA Biplane Vol: 86.1 ml 38.37 ml/m  AORTIC VALVE AV Area (Vmax):    2.32 cm AV Area (Vmean):   2.36 cm AV Area (VTI):     2.65 cm AV Vmax:           119.00 cm/s AV Vmean:          90.200 cm/s AV VTI:            0.228 m AV Peak Grad:      5.7 mmHg AV Mean Grad:      4.0 mmHg LVOT Vmax:         72.60 cm/s LVOT Vmean:        55.900 cm/s LVOT VTI:          0.159 m  LVOT/AV VTI ratio: 0.70  AORTA Ao Root diam: 3.10 cm MR Peak grad:    87.6 mmHg   TRICUSPID VALVE MR Mean grad:    60.0 mmHg   TR Peak grad:   34.8 mmHg MR Vmax:         468.00 cm/s TR Vmax:        295.00 cm/s MR Vmean:        366.0 cm/s MR PISA:         3.08 cm    SHUNTS MR PISA Eff ROA: 20 mm      Systemic VTI:  0.16 m MR PISA Radius:  0.70 cm     Systemic Diam: 2.20 cm Olga Millers MD Electronically signed by Olga Millers MD Signature Date/Time: 06/01/2019/11:53:04 AM    Final     Cardiac Studies   Cath 05/08/17 - [facility] Findings: 1. Significant 3-vessel coronary artery disease. 2. Patent LIMA to LAD. 3. Patent SVG to diffusely diseased RCA.  4. Occluded SVG x2 to OM vessels.  5. Normal LV function; EF estimated at 55%.  6. Elevated left ventricular filling pressures (LVEDP 22 mm Hg).   Recommendations: 1. Aggressive secondary prevention. 2. Further management per inpatient cardiology team.   2D echo 04/2017 - [facility]  Left ventricular hypertrophy - mild  Mildly decreased left ventricular systolic function, ejection fraction  45 to 50%. No significant change compared to prior echo dated 03/05/2017.  Diastolic dysfunction - grade II (elevated filling pressures)  Dilated left atrium - mild  Mildly thickened mitral valve  Mitral regurgitation - mild  Aortic sclerosis  Normal right ventricular systolic function  Mildly elevated right atrial pressure  2D echo 06/01/19 1. Akinesis of the inferolateral wall with overall mild to moderate LV  dysfunction; mild LVH; grade 2 diastolic dysfunction; mild MR; mild LAE;  mild TR.  2. Left ventricular ejection fraction, by estimation, is 40 to 45%. The  left ventricle has mild to moderately decreased function. The left  ventricle demonstrates regional wall motion abnormalities (see scoring  diagram/findings for description). There is  mild left ventricular hypertrophy. Left ventricular diastolic parameters  are  consistent with Grade II diastolic dysfunction (pseudonormalization).  3. Right ventricular systolic function is normal. The right ventricular  size is normal.  4. Left atrial size was mildly dilated.  5. The mitral valve is normal in structure. Mild mitral valve  regurgitation. No evidence of mitral stenosis.  6. The aortic valve is tricuspid. Aortic valve regurgitation  is not  visualized. No aortic stenosis is present.   Patient Profile     54 y.o. 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 Per outside note from 10/2018 she has history of ICD shock 07/2018 for atrial tachycardia and sinus tachycardia with LBBB in context of viral illness. They indicate she is on Eliquis for prior history of stroke. Per EP note 06/01/19 from Dr. Elberta Fortis, ICD interrogation showed that ICD shocks were inappropriate from atrial arrhythmias so changes were made to programming to avoid inappropriate shock. Given elevated troponin, cardiac cath recommended to evaluate for further coronary occlusions.  Assessment & Plan    1. ICD shocks due to atrial arrhythmias - I spoke with Dr. Elberta Fortis today and he reports it was a 1:1 atrial arrhythmia, likely atrial tach. Device reprogrammed to avoid inappropriate therapies. He recommends continuing prior home regimen which includes amiodarone 200mg  and metoprolol 200mg  daily, and follow-up with primary cardiologist. TSH abnormal as below. Will also update LFTs.  2. Elevated troponin/possible NSTEMI in context of history of CAD - peak troponin 6277 - Dr. recommended LHC today to assess for coronary occlusion as inciting event. Risks  and benefits of cardiac catheterization have been discussed with the patient.  These include bleeding, infection, kidney damage, stroke, heart attack, death.  The patient understands these risks and is willing to proceed. She is being premedicated for dye allergy. IM is adjusting insulin. Need to avoid LV gram given renal insufficiency - have communicated this to pre-cath nurse to relay to cathing team. Check lipids/LFTs in AM.  3. Acute on chronic combined CHF - EF 40-45% with akinesis of the inferolateral wall, grade 2 DD, normal RV, mild MR. Developed chest pain, hypoxia requiring NRB, and pulmonary edema on CXR yesterday requiring IV Lasix. Urinated 2.3L out, for net of - . Symptomatically improved. Hold off further IV Lasix given cath and AKI - given pulmonary edema will also hold off fluids as well. D/w Dr. Elberta Fortis.  4. AKI on CKD stage III - prior Cr noted in [facility] of 1.05-1.30. This admission, 1.51->1.47->1.57. Lisinopril and HCTZ on hold. Need to follow cautiously. Would hold off resuming ACEI/ARB/ARNI until we can see what post-cath creatinine does.  5. History of stroke - per outside cardiology notes, on Eliquis for this. Chronic left hemiparesis reported. Do not see overt hx of atrial fib/flutter. Eliquis on hold and now on heparin.  6. Abnormal thyroid function  - TSH elevated at 7.791. Further management per IM. Needs close f/u given amiodarone.  7. Hypomagnesmia - Mg 1.4 on arrival, repleted and now maintaining at normal level.  For questions or updates, please contact [facility] Please consult [email or web address] for contact info under Cardiology/STEMI.  Signed, , PA-C 06/02/2019, 10:00 AM    Patient seen and examined with Laurann Montana PA-C.  Agree as above, with the following exceptions and changes as noted below.Feels well, will be going for cath shortly, no questions or concerns about the procedure. Gen: NAD, CV: RRR, no murmurs, left chest wall generator  pocket has no erythema, edema, well-healed incision lungs: clear, Abd: soft, Extrem: Warm, well perfused, diffuse edema, Neuro/Psych: alert and oriented x 3, normal mood and  affect. All available labs, radiology testing, previous records reviewed.  Due to elevated troponin will go for coronary angiography today.  Device settings have been optimized per Dr. Curt Bears and medical therapy as well.  Elouise Munroe 06/02/19 11:36 AM

## 2019-06-02 NOTE — Interval H&P Note (Signed)
Cath Lab Visit (complete for each Cath Lab visit)  Clinical Evaluation Leading to the Procedure:   ACS: Yes.    Non-ACS:    Anginal Classification: CCS III  Anti-ischemic medical therapy: Maximal Therapy (2 or more classes of medications)  Non-Invasive Test Results: No non-invasive testing performed  Prior CABG: Previous CABG      History and Physical Interval Note:  06/02/2019 4:15 PM  Rachel Rivers  has presented today for surgery, with the diagnosis of NSTEMI.  The various methods of treatment have been discussed with the patient and family. After consideration of risks, benefits and other options for treatment, the patient has consented to  Procedure(s): LEFT HEART CATH AND CORS/GRAFTS ANGIOGRAPHY (N/A) as a surgical intervention.  The patient's history has been reviewed, patient examined, no change in status, stable for surgery.  I have reviewed the patient's chart and labs.  Questions were answered to the patient's satisfaction.     Nicki Guadalajara

## 2019-06-03 LAB — HEPATIC FUNCTION PANEL
ALT: 20 U/L (ref 0–44)
AST: 21 U/L (ref 15–41)
Albumin: 2.8 g/dL — ABNORMAL LOW (ref 3.5–5.0)
Alkaline Phosphatase: 64 U/L (ref 38–126)
Bilirubin, Direct: 0.1 mg/dL (ref 0.0–0.2)
Indirect Bilirubin: 0.4 mg/dL (ref 0.3–0.9)
Total Bilirubin: 0.5 mg/dL (ref 0.3–1.2)
Total Protein: 5.8 g/dL — ABNORMAL LOW (ref 6.5–8.1)

## 2019-06-03 LAB — GLUCOSE, CAPILLARY
Glucose-Capillary: 264 mg/dL — ABNORMAL HIGH (ref 70–99)
Glucose-Capillary: 311 mg/dL — ABNORMAL HIGH (ref 70–99)
Glucose-Capillary: 170 mg/dL — ABNORMAL HIGH (ref 70–99)
Glucose-Capillary: 147 mg/dL — ABNORMAL HIGH (ref 70–99)

## 2019-06-03 LAB — BASIC METABOLIC PANEL
Anion gap: 10 (ref 5–15)
BUN: 33 mg/dL — ABNORMAL HIGH (ref 6–20)
CO2: 28 mmol/L (ref 22–32)
Calcium: 8.4 mg/dL — ABNORMAL LOW (ref 8.9–10.3)
Chloride: 97 mmol/L — ABNORMAL LOW (ref 98–111)
Creatinine, Ser: 1.55 mg/dL — ABNORMAL HIGH (ref 0.44–1.00)
GFR calc Af Amer: 43 mL/min — ABNORMAL LOW (ref >60)
GFR calc non Af Amer: 37 mL/min — ABNORMAL LOW (ref >60)
Glucose, Bld: 295 mg/dL — ABNORMAL HIGH (ref 70–99)
Potassium: 4.5 mmol/L (ref 3.5–5.1)
Sodium: 135 mmol/L (ref 135–145)

## 2019-06-03 LAB — MAGNESIUM: Magnesium: 2.2 mg/dL (ref 1.7–2.4)

## 2019-06-03 LAB — LIPID PANEL
Cholesterol: 129 mg/dL (ref 0–200)
HDL: 43 mg/dL (ref >40)
LDL Cholesterol: 47 mg/dL (ref 0–99)
Total CHOL/HDL Ratio: 3 RATIO
Triglycerides: 197 mg/dL — ABNORMAL HIGH (ref <150)
VLDL: 39 mg/dL (ref 0–40)

## 2019-06-03 MED ORDER — INSULIN ASPART 100 UNIT/ML ~~LOC~~ SOLN
6.0000 [IU] | Freq: Three times a day (TID) | SUBCUTANEOUS | Status: DC
  Administered 2019-06-03 – 2019-06-04 (×4): 6 [IU] via SUBCUTANEOUS

## 2019-06-03 MED ORDER — ISOSORBIDE MONONITRATE ER 30 MG PO TB24
30.0000 mg | ORAL_TABLET | Freq: Every day | ORAL | Status: DC
  Administered 2019-06-03 – 2019-06-04 (×2): 30 mg via ORAL
  Filled 2019-06-03 (×2): qty 1

## 2019-06-03 MED ORDER — FUROSEMIDE 20 MG PO TABS
20.0000 mg | ORAL_TABLET | Freq: Every day | ORAL | Status: DC
  Administered 2019-06-03 – 2019-06-04 (×2): 20 mg via ORAL
  Filled 2019-06-03 (×2): qty 1

## 2019-06-03 NOTE — TOC Initial Note (Signed)
Transition of Care [facility]) - Initial/Assessment Note    Patient Details  Name: Rachel Rivers MRN: [id number] Date of Birth: 1966-01-21  Transition of Care [facility]) CM/SW Contact:    Gala Lewandowsky, RN Phone Number: 06/03/2019, 5:13 PM  Clinical Narrative: Pt presented for ICD firing. Prior to arrival patient was from home with spouse and she takes care of her mother in the home. Patient has Durable medical equipment cane and rolling walker. Patient uses [facility] in [place] and they deliver medications. Patient has primary care provider and she gets to all appointments without any problems. Case Manager will continue to follow for transition of care needs.                    Expected Discharge Plan: Home/Self Care Barriers to Discharge: Continued Medical Work up   Patient Goals and CMS Choice Patient states their goals for this hospitalization and ongoing recovery are:: "to return home" CMS Medicare.gov Compare Post Acute Care list provided to:: Other (Comment Required) Choice offered to / list presented to : NA  Expected Discharge Plan and Services Expected Discharge Plan: Home/Self Care In-house Referral: NA Discharge Planning Services: CM Consult Post Acute Care Choice: NA Living arrangements for the past 2 months: Single Family Home                 DME Arranged: N/A DME Agency: NA       HH Arranged: NA          Prior Living Arrangements/Services Living arrangements for the past 2 months: Single Family Home Lives with:: Spouse, Relatives Patient language and need for interpreter reviewed:: Yes        Need for Family Participation in Patient Care: Yes (Comment) Care giver support system in place?: Yes (comment) Current home services: (Pt has [employer] and [employer] not use.) Criminal Activity/Legal Involvement Pertinent to Current Situation/Hospitalization: No - Comment as needed  Activities of Daily Living Home Assistive Devices/Equipment: CBG  Meter ADL Screening (condition at time of admission) Patient's cognitive ability adequate to safely complete daily activities?: Yes Is the patient deaf or have difficulty hearing?: No Does the patient have difficulty seeing, even when wearing glasses/contacts?: No Does the patient have difficulty concentrating, remembering, or making decisions?: No Patient able to express need for assistance with ADLs?: Yes Does the patient have difficulty dressing or bathing?: No Independently performs ADLs?: Yes (appropriate for developmental age) Does the patient have difficulty walking or climbing stairs?: No Weakness of Legs: None Weakness of Arms/Hands: None  Permission Sought/Granted Permission sought to share information with : Family Supports                Emotional Assessment Appearance:: Appears stated age Attitude/Demeanor/Rapport: Engaged Affect (typically observed): Appropriate Orientation: : Oriented to Situation, Oriented to  Time, Oriented to Place, Oriented to Self Alcohol / Substance Use: Not Applicable Psych Involvement: No (comment)  Admission diagnosis:  Elevated troponin [R77.8] NSTEMI (non-ST elevated myocardial infarction) (HCC) [I21.4] Inappropriate discharge of implantable cardioverter-defibrillator (ICD), initial encounter [T82.198A] Patient Active Problem List   Diagnosis Date Noted  . Acute on chronic combined systolic and diastolic heart failure (HCC) 06/02/2019  . Elevated troponin 06/02/2019  . Hypomagnesemia 06/02/2019  . Elevated TSH 06/02/2019  . NSTEMI (non-ST elevated myocardial infarction) (HCC) 06/01/2019  . Inappropriate shocks from ICD (implantable cardioverter-defibrillator) 06/01/2019  . Anemia 06/01/2019  . Acute kidney injury superimposed on chronic kidney disease (HCC) 06/01/2019  . Abnormal results of thyroid function studies  06/01/2019  . Hemiparesis and alteration of sensations as late effects of stroke ([place]) 04/25/2017   PCP:  Rory Percy, MD Pharmacy:   [facility], [address] [place] [address] Phone: [phone] Fax: [phone]     Social Determinants of Health (SDOH) Interventions    Readmission Risk Interventions Readmission Risk Prevention Plan 06/03/2019  Transportation Screening Complete  PCP or Specialist Appt within 3-5 Days Complete  HRI or [place] Complete  Social Work Consult for [place] Planning/Counseling Complete  Palliative Care Screening Not Applicable  Medication Review [employer] photographer) Complete  Some recent data might be hidden

## 2019-06-03 NOTE — Progress Notes (Signed)
PROGRESS NOTE    Rachel Rivers  JEH:[id number] DOB: August 20, 1965 DOA: 05/31/2019 PCP: Rory Percy, MD   Brief Narrative: 54 y.o. 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  She presented to the emergency department for evaluation of defibrillator firing.  She reported receiving multiple shocks from her ICD.  She denied having any chest pain initially after several shocks she developed substernal nonradiating pressure-like chest pain associated with diaphoresis and nausea. Pt was admitted and seen by cardiology treated as NSTEMI with + TROPS- cardiac cath 4/5, and on medical management.  Subjective: Seen this morning.  No chest pain nausea vomiting fever or chills. Has not gotten up so far from bed, would like to walk in hallway and see how she does Wants to be on Imdur and wants her Lasix back as she is worried about fluid retention.  Assessment & Plan:  NSTEMI,trop 402>>3603>>6277>>4309>>4271, status post cardiac cath see report below medical therapy recommended at this time and consider PCI if angina recurs despite medical therapy.  Adding Imdur this morning.  Continue her beta-blocker, statin, LDL stable at 47.  ICD shocks due to atrial arrhythmia: Seen by EP, device reprogrammed to avoid inappropriate therapies.  Continue home amiodarone, metoprolol, TSH/FT4 elevated.  AKI on CKD stage III, previous creatinine 1.0-1.3, BUN creatinine remains at 1.5 as below.  Monitor, while starting on lasix. Recent Labs  Lab 05/31/19 2324 06/01/19 0537 06/02/19 0531 06/03/19 0346  BUN 25* 28* 27* 33*  CREATININE 1.51* 1.49* 1.57* 1.55*   History of  stroke on Eliquis, resuming as per cardiology.  Acute on chronic combined systolic and diastolic heart failure, LVEF 40 to 45% with a kinesis of the inferolateral wall, G2 DD.  Resuming Lasix today repeat BMP in the morning, holding of ACE inhibitor for now, also on HCTZ.  Monitor intake output and daily weight.  Hypomagnesemia-monitor and replete.  Hypertension blood pressure is stable resume further medication and monitoring.  Elevated TSH 7.7 but also elevated free T4 1.23- not consistent with hypothyroidism wonder it is from amiodarone-??, ?compliance- she is on levothyroxin,need o/p follow-up and evaluation by endocrinology if still abnormal.  Type 2 diabetes mellitus:poorly controlled A1c at 8.4. Continue sliding scale, add premeal insulin 6 units, and cont current bedtime insulin Recent Labs  Lab 06/02/19 1736 06/02/19 1806 06/02/19 2229 06/03/19 0756 06/03/19 1123  GLUCAP 269* 305* 324* 264* 311*    DVT prophylaxis: Eliquis  Code Status:FULL  Family Communication: plan of care discussed with patient at bedside.  Disposition Plan: Patient is from:HOME Anticipated Disposition: to HOME , in 1-2 days Barriers to discharge or conditions that needs to be met prior to discharge: She has met with AICD firing and treated for an NSTEMI, AKI underwent cardiac cath-medical therapy is being optimized remains hospitalized, hopefully discharge once no more chest pain and if able to ambulate with PT OT and once cleared by cardiology.  Consultants:Cardiology Procedures:  Cath 06/02/19  Mid RCA to Dist RCA lesion is 100% stenosed.  Prox RCA lesion is 80% stenosed.  Inf Sept lesion is 95% stenosed.  Ost LAD to Prox LAD lesion is 100% stenosed.  Mid LAD lesion is 80%  stenosed.  1st Mrg lesion is 70% stenosed.  Prox Cx lesion is 80% stenosed.  Prox Cx to Mid Cx lesion is 80% stenosed.  Mid Cx lesion is 80% stenosed.  Severe multivessel native CAD with total ostial occlusion of  the LAD; 80% stenosis of the proximal circumflex immediately after the takeoff of the OM1 vessel with 70% stenosis in the proximal OM1 and 80% in-stent restenosis in the previously placed circumflex stent with 80% stenosis beyond the stented segment in small caliber mid circumflex vessel; and 80% proximal RCA stenosis with total occlusion of the RCA proximal to the acute margin.  Patent LIMA graft supplying the LAD with retrograde filling up to the ostium of the distal LAD extending to around the apex.  Patent vein graft supplying the distal RCA. The PDA is small caliber and has 95% stenosis and a branch vessel.  Old occlusion of 2 vein grafts which had supplied the OM1 and OM 2 vessels of the left circumflex artery.  LVEDP 19 mmHg  RECOMMENDATION: Will review with colleagues. The patient had previously been documented to have occlusions of her grafts which supplied the circumflex marginal 1 and marginal 2 vessels. The remaining grafts including the LIMA graft to LAD and RCA are patent. We will plan an initial attempt at increase medical therapy and will add beta-blocker and amlodipine to her current regimen. If she experiences recurrent chest pain symptomatology may consider potential intervention to her native circumflex vessel.  Microbiology:see note  Nutrition: Diet Order            Diet Heart Room service appropriate? Yes; Fluid consistency: Thin  Diet effective now             Body mass index is 37.36 kg/m.   Medications: Scheduled Meds: . acidophilus  1 capsule Oral Daily  . amiodarone  200 mg Oral Daily  . apixaban  5 mg Oral BID  . aspirin EC  81 mg Oral Daily  . atorvastatin  80 mg Oral QHS  . furosemide  20 mg Oral Daily  . insulin aspart  0-5 Units Subcutaneous QHS  . insulin aspart  0-9 Units Subcutaneous TID WC  . insulin aspart  6 Units Subcutaneous TID WC  . insulin glargine  60 Units Subcutaneous Daily  . isosorbide mononitrate  30 mg Oral Daily  .  levothyroxine  100 mcg Oral Q0600  . metoprolol  200 mg Oral Daily  . multivitamin with minerals  1 tablet Oral Daily  . pantoprazole  40 mg Oral BID  . PARoxetine  10 mg Oral Daily  . sodium chloride flush  3 mL Intravenous Q12H  . sucralfate  1 g Oral QID   Continuous Infusions: . sodium chloride      Antimicrobials: Anti-infectives (From admission, onward)   None       Objective: Vitals: Today's Vitals   06/03/19 0411 06/03/19 0836 06/03/19 0918 06/03/19 1124  BP: 105/69 123/72 116/73 128/86  Pulse: 77 89 95 84  Resp:      Temp: 98.4 F (36.9 C) 97.6 F (36.4 C)  97.8 F (36.6 C)  TempSrc: Oral Oral  Oral  SpO2: 94% 98%  96%  Weight:      Height:      PainSc:        Intake/Output Summary (Last 24 hours) at 06/03/2019 1317 Last data filed at 06/03/2019 1000 Gross per 24 hour  Intake 529.79 ml  Output 550 ml  Net -20.21 ml   Filed  Weights   06/01/19 0654 06/02/19 0350 06/03/19 0400  Weight: 113 kg 113.9 kg 111.4 kg   Weight change: -2.404 kg   Intake/Output from previous day: 04/05 0701 - 04/06 0700 In: 345.4 [P.O.:120; I.V.:225.4] Out: 550 [Urine:550] Intake/Output this shift: Total I/O In: 240 [P.O.:240] Out: -   Examination:  General exam: AAOx3, obese ,NAD, weak appearing. HEENT:Oral mucosa moist, Ear/Nose WNL grossly,dentition normal. Respiratory system: bilaterally clear,no wheezing or crackles,no use of accessory muscle, non tender. Cardiovascular system: S1 & S2 +, regular, No JVD. Gastrointestinal system: Abdomen soft, NT,ND, BS+. Nervous System:Alert, awake, moving extremities and grossly nonfocal Extremities: No edema, distal peripheral pulses palpable.  Skin: No rashes,no icterus. MSK: Normal muscle bulk,tone, power  Data Reviewed: I have personally reviewed following labs and imaging studies CBC: Recent Labs  Lab 05/31/19 2324 06/02/19 0531  WBC 9.7 13.4*  NEUTROABS 7.6  --   HGB 10.4* 11.1*  HCT 35.3* 36.2  MCV 91.7 89.8    PLT 298 161   Basic Metabolic Panel: Recent Labs  Lab 05/31/19 2324 06/01/19 0537 06/01/19 1921 06/02/19 0531 06/03/19 0346  NA 138 138  --  136 135  K 4.0 4.0  --  4.8 4.5  CL 103 99  --  95* 97*  CO2 21* 26  --  28 28  GLUCOSE 148* 142*  --  335* 295*  BUN 25* 28*  --  27* 33*  CREATININE 1.51* 1.49*  --  1.57* 1.55*  CALCIUM 8.2* 8.8*  --  8.7* 8.4*  MG  --  1.4* 2.1 2.0 2.2   GFR: Estimated Creatinine Clearance: 53.7 mL/min (A) (by C-G formula based on SCr of 1.55 mg/dL (H)). Liver Function Tests: Recent Labs  Lab 06/03/19 0346  AST 21  ALT 20  ALKPHOS 64  BILITOT 0.5  PROT 5.8*  ALBUMIN 2.8*   No results for input(s): LIPASE, AMYLASE in the last 168 hours. No results for input(s): AMMONIA in the last 168 hours. Coagulation Profile: No results for input(s): INR, PROTIME in the last 168 hours. Cardiac Enzymes: No results for input(s): CKTOTAL, CKMB, CKMBINDEX, TROPONINI in the last 168 hours. BNP (last 3 results) No results for input(s): PROBNP in the last 8760 hours. HbA1C: Recent Labs    06/01/19 0537  HGBA1C 8.4*   CBG: Recent Labs  Lab 06/02/19 1736 06/02/19 1806 06/02/19 2229 06/03/19 0756 06/03/19 1123  GLUCAP 269* 305* 324* 264* 311*   Lipid Profile: Recent Labs    06/03/19 0346  CHOL 129  HDL 43  LDLCALC 47  TRIG 197*  CHOLHDL 3.0   Thyroid Function Tests: Recent Labs    05/31/19 2349  TSH 7.791*  FREET4 1.23*   Anemia Panel: No results for input(s): VITAMINB12, FOLATE, FERRITIN, TIBC, IRON, RETICCTPCT in the last 72 hours. Sepsis Labs: No results for input(s): PROCALCITON, LATICACIDVEN in the last 168 hours.  Recent Results (from the past 240 hour(s))  SARS CORONAVIRUS 2 (TAT 6-24 HRS) Nasopharyngeal Nasopharyngeal Swab     Status: None   Collection Time: 06/01/19  4:54 AM   Specimen: Nasopharyngeal Swab  Result Value Ref Range Status   SARS Coronavirus 2 NEGATIVE NEGATIVE Final    Comment: (NOTE) SARS-CoV-2 target  nucleic acids are NOT DETECTED. The SARS-CoV-2 RNA is generally detectable in upper and lower respiratory specimens during the acute phase of infection. Negative results do not preclude SARS-CoV-2 infection, do not rule out co-infections with other pathogens, and should not be used as the sole basis for  treatment or other patient management decisions. Negative results must be combined with clinical observations, patient history, and epidemiological information. The expected result is Negative. Fact Sheet for Patients: SugarRoll.be Fact Sheet for Healthcare Providers: https://www.woods-mathews.com/ This test is not yet approved or cleared by the [place] FDA and  has been authorized for detection and/or diagnosis of SARS-CoV-2 by FDA under an Emergency Use Authorization (EUA). This EUA will remain  in effect (meaning this test can be used) for the duration of the COVID-19 declaration under Section 56 4(b)(1) of the Act, 21 U.S.C. section 360bbb-3(b)(1), unless the authorization is terminated or revoked sooner. Performed at [facility], [place] [address] [place], [place] [address]       Radiology Studies: CARDIAC CATHETERIZATION  Result Date: 06/03/2019  Mid RCA to Dist RCA lesion is 100% stenosed.  Prox RCA lesion is 80% stenosed.  Inf Sept lesion is 95% stenosed.  Ost LAD to Prox LAD lesion is 100% stenosed.  Mid LAD lesion is 80% stenosed.  1st Mrg lesion is 70% stenosed.  Prox Cx lesion is 80% stenosed.  Prox Cx to Mid Cx lesion is 80% stenosed.  Mid Cx lesion is 80% stenosed.  Severe multivessel native CAD with total ostial occlusion of the LAD; 80% stenosis of the proximal circumflex immediately after the takeoff of the OM1 vessel with 70% stenosis in the proximal OM1 and 80% in-stent restenosis in the previously placed circumflex stent with 80% stenosis beyond the stented segment in small caliber mid circumflex vessel; and  80% proximal RCA stenosis with total occlusion of the RCA proximal to the acute margin. Patent LIMA graft supplying the LAD with retrograde filling up to the ostium of the distal LAD extending to around the apex. Patent vein graft supplying the distal RCA.  The PDA is small caliber and has 95% stenosis and a branch vessel. Old occlusion of 2 vein grafts which had supplied the OM1 and OM 2 vessels of the left circumflex artery. LVEDP 19 mmHg RECOMMENDATION: Will review with colleagues.  The patient had previously been documented to have occlusions of her grafts which supplied the circumflex marginal 1 and marginal 2 vessels.  The remaining grafts including the LIMA graft to LAD and RCA are patent.  We will plan an initial attempt at increase medical therapy  and will add beta-blocker and amlodipine to her current regimen.  If she experiences recurrent chest pain symptomatology may consider potential intervention to her native circumflex vessel.   DG CHEST PORT 1 VIEW  Result Date: 06/01/2019 CLINICAL DATA:  Chest pain. Defibrillator. EXAM: PORTABLE CHEST 1 VIEW COMPARISON:  Chest x-ray dated 05/31/2019. FINDINGS: New patchy bilateral airspace opacities, lower lobe predominant. No pleural effusion or pneumothorax is seen. Heart size and mediastinal contours are stable. LEFT chest wall pacemaker/ICD apparatus in place. IMPRESSION: New patchy bilateral airspace opacities, lower lobe predominant, most likely pulmonary edema. Pneumonia cannot be excluded if febrile. Electronically Signed   By: Franki Cabot M.D.   On: 06/01/2019 13:59     LOS: 2 days   Time spent: More than 50% of that time was spent in counseling and/or coordination of care.  Antonieta Pert, MD [facility]  06/03/2019, 1:17 PM

## 2019-06-03 NOTE — Progress Notes (Signed)
Inpatient Diabetes Program Recommendations  AACE/ADA: New Consensus Statement on Inpatient Glycemic Control (2015)  Target Ranges:  Prepandial:   less than 140 mg/dL      Peak postprandial:   less than 180 mg/dL (1-2 hours)      Critically ill patients:  140 - 180 mg/dL   Lab Results  Component Value Date   GLUCAP 264 (H) 06/03/2019   HGBA1C 8.4 (H) 06/01/2019    Review of Glycemic Control Results for CELICA, KOTOWSKI (MRN [id number]) as of 06/03/2019 09:41  Ref. Range 06/02/2019 12:01 06/02/2019 17:36 06/02/2019 18:06 06/02/2019 22:29 06/03/2019 07:56  Glucose-Capillary Latest Ref Range: 70 - 99 mg/dL 591 (H) 028 (H) 902 (H) 324 (H) 264 (H)     Diabetes history: DM Outpatient Diabetes medications: Metformin 1000 mg BID + Novolog 26 units TID with meals + Novolog 2 units for every 50 mg/dl above CBG of 284 + Toujeo 60 units Daily Current orders for Inpatient glycemic control: Lantus 60 units Daily + Novolog 0-9 units TID + HS  Inpatient Diabetes Program Recommendations:    MD- When patient resumes her PO diet today, please also consider adding a portion of her home dose of Novolog as Meal Coverage dose:  Novolog 6 units TID with meals (25% total home dose)  Thank you, Dulce Sellar, RN, BSN Diabetes Coordinator Inpatient Diabetes Program [phone] (team pager from 8a-5p)

## 2019-06-03 NOTE — Progress Notes (Signed)
Patient concerned that she is receiving to much of IVF's.Patient asking that fluids be turned off.Patient verbalized that she is drinking enough fluids and has had a lot of IVF's today. Explained to patient IVF's are to flush kidneys after cath procedure and only needs to finished getting this bag.According to patient had respiratory distress last night due to too much fluid and does not want that to happen again tonight.Patient lung sound clear diminished at bases IVF's turned off per request.

## 2019-06-03 NOTE — Progress Notes (Addendum)
Progress Note  Patient Name: Rachel Rivers Date of Encounter: 06/03/2019  Primary Cardiologist: Smitty Cords  Subjective   No issues at cath site. Feeling anxious about not receiving her Lasix due to history of fluid retention when off of it. Breathing is OK, no chest pain.  Inpatient Medications    Scheduled Meds: . acidophilus  1 capsule Oral Daily  . amiodarone  200 mg Oral Daily  . amLODipine  5 mg Oral Daily  . apixaban  5 mg Oral BID  . aspirin EC  81 mg Oral Daily  . atorvastatin  80 mg Oral QHS  . insulin aspart  0-5 Units Subcutaneous QHS  . insulin aspart  0-9 Units Subcutaneous TID WC  . insulin glargine  60 Units Subcutaneous Daily  . levothyroxine  100 mcg Oral Q0600  . metoprolol  200 mg Oral Daily  . multivitamin with minerals  1 tablet Oral Daily  . pantoprazole  40 mg Oral BID  . PARoxetine  10 mg Oral Daily  . sodium chloride flush  3 mL Intravenous Q12H  . sucralfate  1 g Oral QID   Continuous Infusions: . sodium chloride     PRN Meds: sodium chloride, acetaminophen, diazepam, nitroGLYCERIN, ondansetron (ZOFRAN) IV, polyethylene glycol, sodium chloride flush   Vital Signs    Vitals:   06/03/19 0018 06/03/19 0027 06/03/19 0400 06/03/19 0411  BP: 109/70 102/68  105/69  Pulse: 81 89  77  Resp:      Temp:  97.8 F (36.6 C)  98.4 F (36.9 C)  TempSrc:  Oral  Oral  SpO2: 97% 97%  94%  Weight:   111.4 kg   Height:        Intake/Output Summary (Last 24 hours) at 06/03/2019 0751 Last data filed at 06/03/2019 0435 Gross per 24 hour  Intake 345.35 ml  Output 550 ml  Net -204.65 ml   Last 3 Weights 06/03/2019 06/02/2019 06/01/2019  Weight (lbs) 245 lb 11.2 oz 251 lb 249 lb 3.2 oz  Weight (kg) 111.449 kg 113.853 kg 113.036 kg     Telemetry    NSR with occasional PVCs - Personally Reviewed  Physical Exam   GEN: No acute distress.  HEENT: Normocephalic, atraumatic, sclera non-icteric. Neck: No JVD or bruits. Cardiac: RRR no murmurs, rubs, or gallops.    Radials/DP/PT 1+ and equal bilaterally.  Respiratory: Slightly diminished throughout but no overt wheezes, rales or rhonchi. Breathing is unlabored. GI: Soft, nontender, non-distended, BS +x 4. MS: no deformity. Extremities: No clubbing or cyanosis. No significant edema. Distal pedal pulses are 2+ and equal bilaterally. Right groin cath site without hematoma, ecchymosis, or bruit. Neuro:  AAOx3. Follows commands. Psych:  Responds to questions appropriately with a normal affect.  Labs    High Sensitivity Troponin:   Recent Labs  Lab 05/31/19 2324 06/01/19 0156 06/01/19 0537 06/01/19 0912 06/01/19 1354  TROPONINIHS 402* 3,603* 6,277* 4,309* 4,271*      Cardiac EnzymesNo results for input(s): TROPONINI in the last 168 hours. No results for input(s): TROPIPOC in the last 168 hours.   Chemistry Recent Labs  Lab 06/01/19 0537 06/02/19 0531 06/03/19 0346  NA 138 136 135  K 4.0 4.8 4.5  CL 99 95* 97*  CO2 26 28 28   GLUCOSE 142* 335* 295*  BUN 28* 27* 33*  CREATININE 1.49* 1.57* 1.55*  CALCIUM 8.8* 8.7* 8.4*  PROT  --   --  5.8*  ALBUMIN  --   --  2.8*  AST  --   --  21  ALT  --   --  20  ALKPHOS  --   --  64  BILITOT  --   --  0.5  GFRNONAA 39* 37* 37*  GFRAA 45* 43* 43*  ANIONGAP 13 13 10      Hematology Recent Labs  Lab 05/31/19 2324 06/02/19 0531  WBC 9.7 13.4*  RBC 3.85* 4.03  HGB 10.4* 11.1*  HCT 35.3* 36.2  MCV 91.7 89.8  MCH 27.0 27.5  MCHC 29.5* 30.7  RDW 17.8* 17.2*  PLT 298 289    BNPNo results for input(s): BNP, PROBNP in the last 168 hours.   DDimer No results for input(s): DDIMER in the last 168 hours.   Radiology    CARDIAC CATHETERIZATION  Result Date: 06/03/2019  Mid RCA to Dist RCA lesion is 100% stenosed.  Prox RCA lesion is 80% stenosed.  Inf Sept lesion is 95% stenosed.  Ost LAD to Prox LAD lesion is 100% stenosed.  Mid LAD lesion is 80% stenosed.  1st Mrg lesion is 70% stenosed.  Prox Cx lesion is 80% stenosed.  Prox Cx to  Mid Cx lesion is 80% stenosed.  Mid Cx lesion is 80% stenosed.  Severe multivessel native CAD with total ostial occlusion of the LAD; 80% stenosis of the proximal circumflex immediately after the takeoff of the OM1 vessel with 70% stenosis in the proximal OM1 and 80% in-stent restenosis in the previously placed circumflex stent with 80% stenosis beyond the stented segment in small caliber mid circumflex vessel; and 80% proximal RCA stenosis with total occlusion of the RCA proximal to the acute margin. Patent LIMA graft supplying the LAD with retrograde filling up to the ostium of the distal LAD extending to around the apex. Patent vein graft supplying the distal RCA.  The PDA is small caliber and has 95% stenosis and a branch vessel. Old occlusion of 2 vein grafts which had supplied the OM1 and OM 2 vessels of the left circumflex artery. LVEDP 19 mmHg RECOMMENDATION: Will review with colleagues.  The patient had previously been documented to have occlusions of her grafts which supplied the circumflex marginal 1 and marginal 2 vessels.  The remaining grafts including the LIMA graft to LAD and RCA are patent.  We will plan an initial attempt at increase medical therapy  and will add beta-blocker and amlodipine to her current regimen.  If she experiences recurrent chest pain symptomatology may consider potential intervention to her native circumflex vessel.   DG CHEST PORT 1 VIEW  Result Date: 06/01/2019 CLINICAL DATA:  Chest pain. Defibrillator. EXAM: PORTABLE CHEST 1 VIEW COMPARISON:  Chest x-ray dated 05/31/2019. FINDINGS: New patchy bilateral airspace opacities, lower lobe predominant. No pleural effusion or pneumothorax is seen. Heart size and mediastinal contours are stable. LEFT chest wall pacemaker/ICD apparatus in place. IMPRESSION: New patchy bilateral airspace opacities, lower lobe predominant, most likely pulmonary edema. Pneumonia cannot be excluded if febrile. Electronically Signed   By: 07/31/2019  M.D.   On: 06/01/2019 13:59   ECHOCARDIOGRAM COMPLETE  Result Date: 06/01/2019    ECHOCARDIOGRAM REPORT   Patient Name:   Rachel Rivers Date of Exam: 06/01/2019 Medical Rec #:  08/01/2019       Height:       68.0 in Accession #:    [id number]      Weight:       249.2 lb Date of Birth:  05/09/1965       BSA:  2.244 m Patient Age:    54 years        BP:           133/87 mmHg Patient Gender: F               HR:           90 bpm. Exam Location:  Inpatient Procedure: 2D Echo, Color Doppler and Cardiac Doppler Indications:    Ventricular tachycardia I47.2  History:        Patient has no prior history of Echocardiogram examinations.                 CAD, Prior CABG and Defibrillator; Risk Factors:Diabetes and                 Hypertension.  Sonographer:    Ross Ludwig RDCS (AE) Referring Phys: [id number] [facility]  Sonographer Comments: Suboptimal subcostal window and patient is morbidly obese. Image acquisition challenging due to patient body habitus. IMPRESSIONS  1. Akinesis of the inferolateral wall with overall mild to moderate LV dysfunction; mild LVH; grade 2 diastolic dysfunction; mild MR; mild LAE; mild TR.  2. Left ventricular ejection fraction, by estimation, is 40 to 45%. The left ventricle has mild to moderately decreased function. The left ventricle demonstrates regional wall motion abnormalities (see scoring diagram/findings for description). There is  mild left ventricular hypertrophy. Left ventricular diastolic parameters are consistent with Grade II diastolic dysfunction (pseudonormalization).  3. Right ventricular systolic function is normal. The right ventricular size is normal.  4. Left atrial size was mildly dilated.  5. The mitral valve is normal in structure. Mild mitral valve regurgitation. No evidence of mitral stenosis.  6. The aortic valve is tricuspid. Aortic valve regurgitation is not visualized. No aortic stenosis is present. FINDINGS  Left Ventricle: Left ventricular ejection  fraction, by estimation, is 40 to 45%. The left ventricle has mild to moderately decreased function. The left ventricle demonstrates regional wall motion abnormalities. The left ventricular internal cavity size was normal in size. There is mild left ventricular hypertrophy. Left ventricular diastolic parameters are consistent with Grade II diastolic dysfunction (pseudonormalization). Right Ventricle: The right ventricular size is normal.Right ventricular systolic function is normal. Left Atrium: Left atrial size was mildly dilated. Right Atrium: Right atrial size was normal in size. Pericardium: There is no evidence of pericardial effusion. Mitral Valve: The mitral valve is normal in structure. Normal mobility of the mitral valve leaflets. Mild mitral annular calcification. Mild mitral valve regurgitation. No evidence of mitral valve stenosis. Tricuspid Valve: The tricuspid valve is normal in structure. Tricuspid valve regurgitation is mild . No evidence of tricuspid stenosis. Aortic Valve: The aortic valve is tricuspid. Aortic valve regurgitation is not visualized. No aortic stenosis is present. Aortic valve mean gradient measures 4.0 mmHg. Aortic valve peak gradient measures 5.7 mmHg. Aortic valve area, by VTI measures 2.65 cm. Pulmonic Valve: The pulmonic valve was normal in structure. Pulmonic valve regurgitation is not visualized. No evidence of pulmonic stenosis. Aorta: The aortic root is normal in size and structure. Venous: The inferior vena cava was not well visualized. IAS/Shunts: No atrial level shunt detected by color flow Doppler. Additional Comments: Akinesis of the inferolateral wall with overall mild to moderate LV dysfunction; mild LVH; grade 2 diastolic dysfunction; mild MR; mild LAE; mild TR. A pacer wire is visualized.  LEFT VENTRICLE PLAX 2D LVIDd:         4.60 cm      Diastology LVIDs:  3.60 cm      LV e' lateral: 11.50 cm/s LV PW:         1.20 cm      LV e' medial:  6.64 cm/s LV IVS:         1.10 cm LVOT diam:     2.20 cm LV SV:         60 LV SV Index:   27 LVOT Area:     3.80 cm  LV Volumes (MOD) LV vol d, MOD A2C: 91.5 ml LV vol d, MOD A4C: 127.0 ml LV vol s, MOD A2C: 53.9 ml LV vol s, MOD A4C: 80.8 ml LV SV MOD A2C:     37.6 ml LV SV MOD A4C:     127.0 ml LV SV MOD BP:      41.8 ml RIGHT VENTRICLE RV Basal diam:  2.50 cm RV S prime:     5.98 cm/s TAPSE (M-mode): 1.3 cm LEFT ATRIUM             Index       RIGHT ATRIUM           Index LA diam:        4.30 cm 1.92 cm/m  RA Area:     13.20 cm LA Vol (A2C):   76.7 ml 34.18 ml/m RA Volume:   28.30 ml  12.61 ml/m LA Vol (A4C):   87.1 ml 38.81 ml/m LA Biplane Vol: 86.1 ml 38.37 ml/m  AORTIC VALVE AV Area (Vmax):    2.32 cm AV Area (Vmean):   2.36 cm AV Area (VTI):     2.65 cm AV Vmax:           119.00 cm/s AV Vmean:          90.200 cm/s AV VTI:            0.228 m AV Peak Grad:      5.7 mmHg AV Mean Grad:      4.0 mmHg LVOT Vmax:         72.60 cm/s LVOT Vmean:        55.900 cm/s LVOT VTI:          0.159 m LVOT/AV VTI ratio: 0.70  AORTA Ao Root diam: 3.10 cm MR Peak grad:    87.6 mmHg   TRICUSPID VALVE MR Mean grad:    60.0 mmHg   TR Peak grad:   34.8 mmHg MR Vmax:         468.00 cm/s TR Vmax:        295.00 cm/s MR Vmean:        366.0 cm/s MR PISA:         3.08 cm    SHUNTS MR PISA Eff ROA: 20 mm      Systemic VTI:  0.16 m MR PISA Radius:  0.70 cm     Systemic Diam: 2.20 cm Kirk Ruths MD Electronically signed by Kirk Ruths MD Signature Date/Time: 06/01/2019/11:53:04 AM    Final     Cardiac Studies   Cath 05/08/17 - [facility] Findings: 1. Significant 3-vessel coronary artery disease. 2. Patent LIMA to LAD. 3. Patent SVG to diffusely diseased RCA.  4. Occluded SVG x2 to OM vessels.  5. Normal LV function; EF estimated at 55%.  6. Elevated left ventricular filling pressures (LVEDP 22 mm Hg).   Recommendations: 1. Aggressive secondary prevention. 2. Further management per inpatient cardiology team.   2D echo 04/2017 -  [facility]  Left  ventricular hypertrophy - mild  Mildly decreased left ventricular systolic function, ejection fraction  45 to 50%. No significant change compared to prior echo dated 03/05/2017.  Diastolic dysfunction - grade II (elevated filling pressures)  Dilated left atrium - mild  Mildly thickened mitral valve  Mitral regurgitation - mild  Aortic sclerosis  Normal right ventricular systolic function  Mildly elevated right atrial pressure  2D echo 06/01/19 1. Akinesis of the inferolateral wall with overall mild to moderate LV  dysfunction; mild LVH; grade 2 diastolic dysfunction; mild MR; mild LAE;  mild TR.  2. Left ventricular ejection fraction, by estimation, is 40 to 45%. The  left ventricle has mild to moderately decreased function. The left  ventricle demonstrates regional wall motion abnormalities (see scoring  diagram/findings for description). There is  mild left ventricular hypertrophy. Left ventricular diastolic parameters  are consistent with Grade II diastolic dysfunction (pseudonormalization).  3. Right ventricular systolic function is normal. The right ventricular  size is normal.  4. Left atrial size was mildly dilated.  5. The mitral valve is normal in structure. Mild mitral valve  regurgitation. No evidence of mitral stenosis.  6. The aortic valve is tricuspid. Aortic valve regurgitation is not  visualized. No aortic stenosis is present.   Cath 06/02/19  Mid RCA to Dist RCA lesion is 100% stenosed.  Prox RCA lesion is 80% stenosed.  Inf Sept lesion is 95% stenosed.  Ost LAD to Prox LAD lesion is 100% stenosed.  Mid LAD lesion is 80% stenosed.  1st Mrg lesion is 70% stenosed.  Prox Cx lesion is 80% stenosed.  Prox Cx to Mid Cx lesion is 80% stenosed.  Mid Cx lesion is 80% stenosed.   Severe multivessel native CAD with total ostial occlusion of the LAD; 80% stenosis of the proximal circumflex immediately after the takeoff of the OM1  vessel with 70% stenosis in the proximal OM1 and 80% in-stent restenosis in the previously placed circumflex stent with 80% stenosis beyond the stented segment in small caliber mid circumflex vessel; and 80% proximal RCA stenosis with total occlusion of the RCA proximal to the acute margin.  Patent LIMA graft supplying the LAD with retrograde filling up to the ostium of the distal LAD extending to around the apex.  Patent vein graft supplying the distal RCA.  The PDA is small caliber and has 95% stenosis and a branch vessel.  Old occlusion of 2 vein grafts which had supplied the OM1 and OM 2 vessels of the left circumflex artery.  LVEDP 19 mmHg  RECOMMENDATION: Will review with colleagues.  The patient had previously been documented to have occlusions of her grafts which supplied the circumflex marginal 1 and marginal 2 vessels.  The remaining grafts including the LIMA graft to LAD and RCA are patent.  We will plan an initial attempt at increase medical therapy  and will add beta-blocker and amlodipine to her current regimen.  If she experiences recurrent chest pain symptomatology may consider potential intervention to her native circumflex vessel.  Patient Profile     54 y.o. 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 Per outside note from 10/2018 she has history of ICD shock 07/2018 for  atrial tachycardia and sinus tachycardia with LBBB in context of viral illness. They indicate she is on Eliquis for prior history of stroke. Per EP note 06/01/19 from Dr. Elberta Fortisamnitz, ICD interrogation showed that ICD shocks were inappropriate from atrial arrhythmias so changes  were made to programming to avoid inappropriate shock. Given elevated troponin, cardiac cath recommended to evaluate for further coronary occlusions.  Assessment & Plan    1. ICD shocks due to atrial arrhythmias - per Dr. Elberta Fortisamnitz, shock was due to a 1:1 atrial arrhythmia, likely atrial tach. Device reprogrammed to avoid inappropriate therapies. He recommended continuing prior home regimen which includes amiodarone 200mg  and metoprolol 200mg  daily and follow-up with primary cardiologist in [facility]. See #6 re: TSH.    2. Elevated troponin/possible NSTEMI in context of history of CAD - peak troponin 6277. LHC 06/02/19 as outlined above, patent LIMA-LAD, VG-dLAD, old occluded 2 VG. Medical therapy recommended, can consider PCI down the road if angina recurs despite medical therapy. Amlodipine was suggested by interventionalist - got first dose of amlodipine last night. The patient reports she was told nitrates may be another option. This may make more sense given her decreased EF. Continue BB & statin. Lipids show good control with LDL of 47.  3. Acute on chronic combined CHF - EF 40-45% with akinesis of the inferolateral wall, grade 2 DD, normal RV, mild MR. On 06/01/19 she developed chest pain, hypoxia requiring NRB, and pulmonary edema on CXR yesterday in setting of holding of diuretics. She appears generally stable post-cath. LVEDP was 19mmHg. Cr remains around 1.5 range. Will restart Lasix today. Will discuss med regimen with MD regarding ACEI resumption. Would probably hold off on resuming home HCTZ; can consider spironolactone as outpatient if additional diuretic control needed.  4. AKI on CKD stage III - prior Cr noted in [facility] of 1.05-1.30. This admission, 1.51->1.47->1.57->1.55. Monitor in context of above notation.  5. History of stroke - per outside cardiology notes, on Eliquis for this. Chronic left hemiparesis reported. Do not see overt hx of atrial fib/flutter. OK to resume  Eliquis this AM.  6. Abnormal thyroid function  - TSH elevated at 7.791 and free T4 is also elevated. Further management per IM. Plan to recheck as outpatient.  7. Hypomagnesmia - Mg 1.4 on arrival, repleted and now maintaining at normal level.  For questions or updates, please contact [facility] Please consult [email or web address] for contact info under Cardiology/STEMI.  Signed, Laurann Montanaayna N Dunn, PA-C 06/03/2019, 7:51 AM    Patient seen and examined with Ronie Spiesayna Dunn, PA-C.  Agree as above, with the following exceptions and changes as noted below. Feels ok today but eager to receive lasix. Gen: NAD, CV: RRR, no murmurs, Lungs: clear, Abd: soft, Extrem: Warm, well perfused, no edema, Neuro/Psych: alert and oriented x 3, normal mood and affect. All available labs, radiology testing, previous records reviewed. Stable from EP standpoint, will add imdur to regimen, consider spironolactone as outpatient if renal function permits.  Parke PoissonGayatri A Desirai Traxler 06/03/19 12:14 PM

## 2019-06-03 NOTE — Evaluation (Signed)
Physical Therapy Evaluation Patient Details Name: Rachel Rivers MRN: [id number] DOB: 30-Jun-1965 Today's Date: 06/03/2019   History of Present Illness  54 y.o. female with medical history significant of CAD status post multiple PCI's and CABG, VT on amiodarone with ICD in place, hypertension, type 2 diabetes, CVA being brought to the ED for evaluation of defibrillator firing. in ED founf to be tachycardic and tachypneic after inappropriate ICD shocks for sinus tachycardia. cardiologix recommended tx as NSTEMi.  Clinical Impression   Pt admitted with above diagnosis. PTA was living home with spouse reports independence with all ADLs and mobility, has equipment but states was not using AD for ambulation. Also takes care of mother, assist with meds etc. Pt currently with functional limitations due to the deficits listed below (see PT Problem List). This pm pt did well functionally and with mobility, slight decrease in problem solving noted but this seems to be pt's baseline functioning. Pt was able to complete bed mob and transfers with stand by assist and line management help, was able to ambulate approx 115ft w/ no AD and min guard assist. Pt was on 2L/min via HFNC and sats remained in 90s with ambulation. Upon return to room needed to wash up, noted desat to low 80s with this, even though pt did not seem to be in any distress.  Pt will benefit from skilled PT to increase their independence and safety with mobility to allow discharge to the venue listed below.       Follow Up Recommendations No PT follow up    Equipment Recommendations  None recommended by PT    Recommendations for Other Services       Precautions / Restrictions Precautions Precautions: Fall Restrictions Weight Bearing Restrictions: No      Mobility  Bed Mobility Overal bed mobility: Needs Assistance Bed Mobility: Supine to Sit     Supine to sit: Supervision     General bed mobility comments: needs bed rail to  complete  Transfers Overall transfer level: Needs assistance Equipment used: None Transfers: Sit to/from Stand;Stand Pivot Transfers Sit to Stand: Supervision Stand pivot transfers: Supervision          Ambulation/Gait Ambulation/Gait assistance: Min guard Gait Distance (Feet): 160 Feet Assistive device: None;1 person hand held assist Gait Pattern/deviations: Step-through pattern     General Gait Details: Hydrographic surveyor    Modified Rankin (Stroke Patients Only)       Balance Overall balance assessment: Needs assistance Sitting-balance support: Feet supported Sitting balance-Leahy Scale: Good Sitting balance - Comments: sat edge of bed with no support w/o lobs   Standing balance support: During functional activity Standing balance-Leahy Scale: Fair                               Pertinent Vitals/Pain Pain Assessment: No/denies pain    Home Living Family/patient expects to be discharged to:: Private residence Living Arrangements: Spouse/significant other Available Help at Discharge: Family Type of Home: House Home Access: Stairs to enter Entrance Stairs-Rails: Right   Home Layout: One level Home Equipment: Shower seat - built in;Grab bars - toilet;Walker - 2 wheels;Cane - single point;Wheelchair - manual;Hand held International aid/development worker)      Prior Function Level of Independence: Independent               Hand Dominance   Dominant Hand: Right  Extremity/Trunk Assessment   Upper Extremity Assessment Upper Extremity Assessment: Generalized weakness    Lower Extremity Assessment Lower Extremity Assessment: Generalized weakness    Cervical / Trunk Assessment Cervical / Trunk Assessment: Normal  Communication   Communication: No difficulties  Cognition Arousal/Alertness: Awake/alert Behavior During Therapy: WFL for tasks assessed/performed Overall Cognitive Status: Within Functional Limits for  tasks assessed                                        General Comments General comments (skin integrity, edema, etc.): on 2L/min via [place] sats in 90s with ambulation, with cleaning and changing under wear at edge of bed sats decrease to low 80s on 1L/min  HR max 97bpm    Exercises     Assessment/Plan    PT Assessment Patient needs continued PT services  PT Problem List Decreased activity tolerance;Decreased balance;Decreased mobility;Decreased coordination;Decreased safety awareness       PT Treatment Interventions DME instruction;Gait training;Stair training;Functional mobility training;Therapeutic activities;Therapeutic exercise;Balance training;Neuromuscular re-education;Patient/family education    PT Goals (Current goals can be found in the Care Plan section)  Acute Rehab PT Goals Patient Stated Goal: to be more mobile states usually does not sit around much PT Goal Formulation: With patient Time For Goal Achievement: 06/17/19 Potential to Achieve Goals: Good    Frequency Min 3X/week   Barriers to discharge        Co-evaluation               AM-PAC PT "6 Clicks" Mobility  Outcome Measure Help needed turning from your back to your side while in a flat bed without using bedrails?: None Help needed moving from lying on your back to sitting on the side of a flat bed without using bedrails?: None Help needed moving to and from a bed to a chair (including a wheelchair)?: A Little Help needed standing up from a chair using your arms (e.g., wheelchair or bedside chair)?: A Little Help needed to walk in hospital room?: A Little Help needed climbing 3-5 steps with a railing? : A Little 6 Click Score: 20    End of Session Equipment Utilized During Treatment: Gait belt;Oxygen Activity Tolerance: Patient tolerated treatment well Patient left: in chair;with call bell/phone within reach;with nursing/sitter in room Nurse Communication: Mobility status PT Visit  Diagnosis: Other abnormalities of gait and mobility (R26.89);History of falling (Z91.81);Unsteadiness on feet (R26.81)    Time: 2353-6144 PT Time Calculation (min) (ACUTE ONLY): 38 min   Charges:   PT Evaluation $PT Eval Moderate Complexity: 1 Mod PT Treatments $Gait Training: 8-22 mins $Therapeutic Activity: 8-22 mins        Horald Chestnut, PT   Delford Field 06/03/2019, 2:02 PM

## 2019-06-04 LAB — GLUCOSE, CAPILLARY
Glucose-Capillary: 257 mg/dL — ABNORMAL HIGH (ref 70–99)
Glucose-Capillary: 225 mg/dL — ABNORMAL HIGH (ref 70–99)

## 2019-06-04 LAB — BASIC METABOLIC PANEL
Anion gap: 10 (ref 5–15)
BUN: 30 mg/dL — ABNORMAL HIGH (ref 6–20)
CO2: 30 mmol/L (ref 22–32)
Calcium: 8.5 mg/dL — ABNORMAL LOW (ref 8.9–10.3)
Chloride: 99 mmol/L (ref 98–111)
Creatinine, Ser: 1.48 mg/dL — ABNORMAL HIGH (ref 0.44–1.00)
GFR calc Af Amer: 46 mL/min — ABNORMAL LOW (ref >60)
GFR calc non Af Amer: 39 mL/min — ABNORMAL LOW (ref >60)
Glucose, Bld: 169 mg/dL — ABNORMAL HIGH (ref 70–99)
Potassium: 4.3 mmol/L (ref 3.5–5.1)
Sodium: 139 mmol/L (ref 135–145)

## 2019-06-04 LAB — MAGNESIUM: Magnesium: 2 mg/dL (ref 1.7–2.4)

## 2019-06-04 MED ORDER — ISOSORBIDE MONONITRATE ER 30 MG PO TB24: 30.0000 mg | ORAL_TABLET | Freq: Every day | ORAL | 0 refills | Status: AC

## 2019-06-04 NOTE — Evaluation (Signed)
Occupational Therapy Evaluation Patient Details Name: Rachel Rivers MRN: [id number] DOB: March 07, 1965 Today's Date: 06/04/2019    History of Present Illness 54 y.o. female with medical history significant of CAD status post multiple PCI's and CABG, VT on amiodarone with ICD in place, hypertension, type 2 diabetes, CVA being brought to the ED for evaluation of defibrillator firing. in ED founf to be tachycardic and tachypneic after inappropriate ICD shocks for sinus tachycardia. cardiologix recommended tx as NSTEMi.   Clinical Impression   PTA pt living independently and caring for mother with dementia. At time of eval, pt completed bed mobility and transfers at supervision level. Pt demonstrated ability to complete BADL at mod I level an dis very proficient with one handed strategies from previous CVA. Pt completed functional mobility beyond household distance at supervision level. Pt on RA with SpO2 stable and HR high 90s- 110 bpm. No further OT needs identified. OT will sign off, thank you for this consult.     Follow Up Recommendations  No OT follow up    Equipment Recommendations  None recommended by OT    Recommendations for Other Services       Precautions / Restrictions Precautions Precautions: Fall Restrictions Weight Bearing Restrictions: No      Mobility Bed Mobility Overal bed mobility: Needs Assistance Bed Mobility: Supine to Sit     Supine to sit: Supervision     General bed mobility comments: uses bed rail  Transfers Overall transfer level: Needs assistance Equipment used: None Transfers: Sit to/from Stand Sit to Stand: Supervision Stand pivot transfers: Supervision            Balance Overall balance assessment: Needs assistance Sitting-balance support: Feet supported Sitting balance-Leahy Scale: Good     Standing balance support: During functional activity Standing balance-Leahy Scale: Fair Standing balance comment: mildly guarded but functional  and aware of limits                           ADL either performed or assessed with clinical judgement   ADL Overall ADL's : Modified independent;At baseline                                       General ADL Comments: Pt demonstrates ability to complete BADL at mod I level. Pt donned pants at mod I level and proficient with one handed strategies. Toilet transfer also completed at mod I level. Pt then completed beyond household distance of functional mobility without assist with VSS on RA.     Vision Baseline Vision/History: Wears glasses Wears Glasses: At all times Patient Visual Report: No change from baseline       Perception     Praxis      Pertinent Vitals/Pain Pain Assessment: No/denies pain     Hand Dominance Right   Extremity/Trunk Assessment Upper Extremity Assessment Upper Extremity Assessment: LUE deficits/detail LUE Deficits / Details: baseline hemiplegia from prior stroke   Lower Extremity Assessment Lower Extremity Assessment: Overall WFL for tasks assessed       Communication Communication Communication: No difficulties   Cognition Arousal/Alertness: Awake/alert Behavior During Therapy: WFL for tasks assessed/performed Overall Cognitive Status: Within Functional Limits for tasks assessed  General Comments: pt is overall functional for tasks assessed. Suspect some STM deficits due to repeating speech at times   General Comments       Exercises     Shoulder Instructions      Home Living Family/patient expects to be discharged to:: Private residence Living Arrangements: Spouse/significant other Available Help at Discharge: Family Type of Home: House Home Access: Stairs to enter   Entrance Stairs-Rails: Right Home Layout: One level     Bathroom Shower/Tub: Producer, television/film/video: Handicapped height Bathroom Accessibility: Yes How Accessible: Accessible via  walker Home Equipment: Shower seat - built in;Grab bars - toilet;Walker - 2 wheels;Cane - single point;Wheelchair - manual;Hand held shower head          Prior Functioning/Environment Level of Independence: Independent        Comments: since stroke has been independent with BADL/IADL. Takes care of elderly mother with dementia        OT Problem List: Cardiopulmonary status limiting activity;Decreased activity tolerance      OT Treatment/Interventions:      OT Goals(Current goals can be found in the care plan section) Acute Rehab OT Goals Patient Stated Goal: continue to be mobile and care for mother OT Goal Formulation: With patient Time For Goal Achievement: 06/18/19 Potential to Achieve Goals: Good  OT Frequency:     Barriers to D/C:            Co-evaluation              AM-PAC OT "6 Clicks" Daily Activity     Outcome Measure Help from another person eating meals?: None Help from another person taking care of personal grooming?: None Help from another person toileting, which includes using toliet, bedpan, or urinal?: None Help from another person bathing (including washing, rinsing, drying)?: None Help from another person to put on and taking off regular upper body clothing?: None Help from another person to put on and taking off regular lower body clothing?: None 6 Click Score: 24   End of Session Nurse Communication: Mobility status  Activity Tolerance: Patient tolerated treatment well Patient left: in bed;with call bell/phone within reach  OT Visit Diagnosis: Unsteadiness on feet (R26.81);Other abnormalities of gait and mobility (R26.89);Hemiplegia and hemiparesis Hemiplegia - Right/Left: Left Hemiplegia - dominant/non-dominant: Non-Dominant(baseline from old CVA)                Time: 0837-0900 OT Time Calculation (min): 23 min Charges:  OT General Charges $OT Visit: 1 Visit OT Evaluation $OT Eval Moderate Complexity: 1 Mod  Dalphine Handing, MSOT,  OTR/L Acute Rehabilitation Services [facility] Office Number: [phone] Pager: [phone]  Dalphine Handing 06/04/2019, 11:13 AM

## 2019-06-04 NOTE — Discharge Summary (Signed)
Physician Discharge Summary  Rachel Rivers WUJ:[id number] DOB: 1965-12-24 DOA: 05/31/2019  PCP: Rory Percy, MD  Admit date: 05/31/2019 Discharge date: 06/04/2019  Admitted From: home Disposition:  home  Recommendations for Outpatient Follow-up:  1.Follow up with PCP in 1-2 weeks-need o/p follow-up TSH in 2 wk and evaluation by endocrinology if still abnormal. 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 She presented to the emergency department for evaluation of defibrillator firing.She reported receiving multiple shocks from her ICD. She denied having any chest pain initially after several shocks she developed substernal nonradiating pressure-like chest pain associated with diaphoresis and nausea.Pt was admitted and seen by cardiology treated as NSTEMI with + TROPS- cardiac cath 4/5, and on medical management, added imdur. She is back on lasix, on RA, n omore short of breath and no CP.  Seen by the cardiologist today medical therapy optimized and she has been cleared for discharge home.  Discharge Diagnoses:   NSTEMI,trop 402>>[id number]>>[id number]>>[id number]>>[id number], status post cardiac cath see report below medical therapy recommended at this time and consider PCI if angina recurs despite medical therapy.  Added imdur  Continue her beta-blocker, statin, LDL stable at 47.  Chest pain-free cleared for discharge  home today.  ICD shocks due to atrial arrhythmia: Seen by EP, device reprogrammed to avoid inappropriate therapies.  Continue home amiodarone, metoprolol, TSH/FT4 elevated.  AKI on CKD stage III, previous creatinine 1.0-1.3, BUN creatinine improved to 1.4, okay to resume acei per cardio.  History of stroke on Eliquis.  Acute on chronic combined systolic and diastolic heart failure, LVEF 40 to 45% with a kinesis of the inferolateral wall, G2 DD.  cont Lasix, acei, beta blocker.  Hypomagnesemia-repleted  Hypertension blood pressure is stable continue home meds.   Elevated TSH 7.7 but also elevated free T4 1.23- not consistent with hypothyroidism wonder it is from amiodarone-??, ?compliance- she is on levothyroxin,need o/p follow-up and evaluation by endocrinology if still abnormal.  Type 2 diabetes mellitus:poorly controlled A1c at 8.4. Continue sliding scale, add premeal insulin 6 units, and cont current bedtime insulin Consults: cardio  Subjective: Resting well, no chest pain or shortness of breath off oxygen this morning and doing well on room air. Wanting to  go home today. Discharge Exam: Vitals:   06/04/19 0414 06/04/19 0941  BP: 117/75 (!) 166/77  Pulse: 79 80  Resp: 19   Temp: 98 F (36.7 C)   SpO2: 96%    General: Pt is alert, awake, not in acute distress Cardiovascular: RRR, S1/S2 +, no rubs, no gallops Respiratory: CTA bilaterally, no wheezing, no rhonchi Abdominal: Soft, NT, ND, bowel sounds + Extremities: no edema, no cyanosis  Discharge Instructions  Discharge Instructions    (HEART FAILURE PATIENTS) Call MD:  Anytime you have any of the following symptoms: 1) 3 pound weight gain in 24 hours or 5 pounds in 1 week 2) shortness of breath, with or without a dry hacking cough 3) swelling in the hands, feet or  stomach 4) if you have to sleep on extra pillows at night in order to breathe.   Complete by: As directed    Diet - low sodium heart healthy   Complete  by: As directed    Discharge instructions   Complete by: As directed    Follow-up with your cardiology at [facility] in a week and w/ primary care physician.  Please repeat your thyroid function test in the next 2 weeks and follow-up with PCP/ or Endocrinology.  Please check BMP from your PCP in 1 wk.  Please call call MD or return to ER for similar or worsening recurring problem that brought you to hospital or if any fever,nausea/vomiting,abdominal pain, uncontrolled pain, chest pain,  shortness of breath or any other alarming symptoms.  Please follow-up your doctor as instructed in a week time and call the office for appointment.  Please avoid alcohol, smoking, or any other illicit substance and maintain healthy habits including taking your regular medications as prescribed.  You were cared for by a hospitalist during your hospital stay. If you have any questions about your discharge medications or the care you received while you were in the hospital after you are discharged, you can call the unit and ask to speak with the hospitalist on call if the hospitalist that took care of you is not available.  Once you are discharged, your primary care physician will handle any further medical issues. Please note that NO REFILLS for any discharge medications will be authorized once you are discharged, as it is imperative that you return to your primary care physician (or establish a relationship with a primary care physician if you do not have one) for your aftercare needs so that they can reassess your need for medications and monitor your lab values   Increase activity slowly   Complete by: As directed      Allergies as of 06/04/2019      Reactions   Mushroom Extract Complex Anaphylaxis   Glucose Diarrhea   Other reaction(s): Other (See Comments) CAN HAVE IV GLUCOSE NOT ORAL GLUCOSE DUE TO GASTRIC BYPASS AND DUMPING SYNDROME-EXPLOSIVE DIARRHEA AND TACHYCARDIA   Iodinated Diagnostic Agents Hives    Nsaids    Lactose Diarrhea      Medication List    STOP taking these medications   hydrochlorothiazide 25 MG tablet Commonly known as: HYDRODIURIL     TAKE these medications   acetaminophen 500 MG tablet Commonly known as: TYLENOL Take 1,000 mg by mouth at bedtime.   amiodarone 200 MG tablet Commonly known as: PACERONE Take 200 mg by mouth every morning.   apixaban 5 MG Tabs tablet Commonly known as: ELIQUIS Take 5 mg by mouth 2 (two) times daily.   aspirin EC 81 MG tablet Take 1 tablet (81 mg total) by mouth daily.   atorvastatin 80 MG tablet Commonly known as: LIPITOR Take 80 mg by mouth at bedtime.   CALCIUM-D PO Take by mouth.   cannabidiol 100 MG/ML solution Commonly known as: EPIDIOLEX Take 500 mg by mouth at bedtime.   diphenhydrAMINE 50 MG tablet Commonly known as: BENADRYL Take 50 mg by mouth at bedtime.   docusate sodium 100 MG capsule Commonly known as: COLACE Take 100 mg by mouth daily as needed for mild constipation.   furosemide 20 MG tablet Commonly known as: LASIX Take 20 mg by mouth daily.   isosorbide mononitrate 30 MG 24 hr tablet Commonly known as: IMDUR Take 1 tablet (30 mg total) by mouth  daily.   levothyroxine 100 MCG tablet Commonly known as: SYNTHROID Take 100 mcg by mouth daily before breakfast.   metFORMIN 1000 MG tablet Commonly known as: GLUCOPHAGE Take 1,000 mg by mouth 2 (two) times daily.   metoprolol 200 MG 24 hr tablet Commonly known as: TOPROL-XL Take 200 mg by mouth daily.   multivitamin with minerals Tabs tablet Take 1 tablet by mouth daily.   nitroGLYCERIN 0.4 MG/SPRAY spray Commonly known as: NITROLINGUAL Place 1 spray under the tongue every 5 (five) minutes x 3 doses as needed for chest pain.   NovoLOG FlexPen 100 UNIT/ML FlexPen Generic drug: insulin aspart Inject 26-40 Units into the skin See admin instructions. Inject 26 units into the skin 15 minutes before meals. Add 2 units per glucose 50 above  150.   ondansetron 4 MG tablet Commonly known as: ZOFRAN Take 4 mg by mouth every 8 (eight) hours as needed for nausea.   pantoprazole 40 MG tablet Commonly known as: PROTONIX Take 40 mg by mouth 2 (two) times daily.   PARoxetine 10 MG tablet Commonly known as: PAXIL Take 10 mg by mouth daily.   polyethylene glycol 17 g packet Commonly known as: MIRALAX / GLYCOLAX Take 17 g by mouth daily as needed for mild constipation.   quinapril 5 MG tablet Commonly known as: ACCUPRIL TAKE ONE TABLET BY MOUTH DAILY.   RA Probiotic Digestive Care Caps Take 1 capsule by mouth daily.   sucralfate 1 g tablet Commonly known as: CARAFATE Take 1 g by mouth 4 (four) times daily.   Toujeo Max SoloStar 300 UNIT/ML Solostar Pen Generic drug: insulin glargine (2 Unit Dial) Inject 60 Units into the skin every morning.   VITAMIN D PO Take 100 Units by mouth daily.      Follow-up Information    Selinda Flavin, MD Follow up in 1 week(s).   Specialty: Family Medicine Contact information: [address] [place] [address] [phone]          Allergies  Allergen Reactions  . Mushroom Extract Complex Anaphylaxis  . 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 Iodinated Diagnostic Agents Hives  . Nsaids   . Lactose Diarrhea    The results of significant diagnostics from this hospitalization (including imaging, microbiology, ancillary and laboratory) are listed below for reference.    Microbiology: Recent Results (from the past 240 hour(s))  SARS CORONAVIRUS 2 (TAT 6-24 HRS) Nasopharyngeal Nasopharyngeal Swab     Status: None   Collection Time: 06/01/19  4:54 AM   Specimen: Nasopharyngeal Swab  Result Value Ref Range Status   SARS Coronavirus 2 NEGATIVE NEGATIVE Final    Comment: (NOTE) SARS-CoV-2 target nucleic acids are NOT DETECTED. The SARS-CoV-2  RNA is generally detectable in upper and lower respiratory specimens during the acute phase of infection. Negative results do not preclude SARS-CoV-2 infection, do not rule out co-infections with other pathogens, and should not be used as the sole basis for treatment or other patient management decisions. Negative results must be combined with clinical observations, patient history, and epidemiological information. The expected result is Negative. Fact Sheet for Patients: HairSlick.no Fact Sheet for Healthcare Providers: quierodirigir.com This test is not yet approved or cleared by the [place] FDA and  has been authorized for detection and/or diagnosis of SARS-CoV-2 by FDA under an Emergency Use Authorization (EUA). This EUA will remain  in effect (meaning this test can be used) for the duration of the COVID-19 declaration under Section 56 4(b)(1) of the Act, 21 U.S.C. section 360bbb-3(b)(1), unless the authorization is terminated or revoked sooner. Performed at [facility], [address], [place], [place] [address]     Procedures/Studies: CARDIAC CATHETERIZATION  Result Date: 06/03/2019  Mid RCA to Dist RCA lesion is 100% stenosed.  Prox RCA lesion is 80% stenosed.  Inf Sept lesion is 95% stenosed.  Ost LAD to Prox LAD lesion is 100% stenosed.  Mid LAD lesion is 80% stenosed.  1st Mrg lesion is 70% stenosed.  Prox Cx lesion is 80% stenosed.  Prox Cx to Mid Cx lesion is 80% stenosed.  Mid Cx lesion is 80% stenosed.  Severe multivessel native CAD with total ostial occlusion of the LAD; 80% stenosis of the proximal circumflex immediately after the takeoff of the OM1 vessel with 70% stenosis in the proximal OM1 and 80% in-stent restenosis in the previously placed circumflex stent with 80% stenosis beyond the stented segment in small caliber mid circumflex vessel; and 80% proximal RCA stenosis with total occlusion of the  RCA proximal to the acute margin. Patent LIMA graft supplying the LAD with retrograde filling up to the ostium of the distal LAD extending to around the apex. Patent vein graft supplying the distal RCA.  The PDA is small caliber and has 95% stenosis and a branch vessel. Old occlusion of 2 vein grafts which had supplied the OM1 and OM 2 vessels of the left circumflex artery. LVEDP 19 mmHg RECOMMENDATION: Will review with colleagues.  The patient had previously been documented to have occlusions of her grafts which supplied the circumflex marginal 1 and marginal 2 vessels.  The remaining grafts including the LIMA graft to LAD and RCA are patent.  We will plan an initial attempt at increase medical therapy  and will add beta-blocker and amlodipine to her current regimen.  If she experiences recurrent chest pain symptomatology may consider potential intervention to her native circumflex vessel.   DG CHEST PORT 1 VIEW  Result Date: 06/01/2019 CLINICAL DATA:  Chest pain. Defibrillator. EXAM: PORTABLE CHEST 1 VIEW COMPARISON:  Chest x-ray dated 05/31/2019. FINDINGS: New patchy bilateral airspace opacities, lower lobe predominant. No pleural effusion or pneumothorax is seen. Heart size and mediastinal contours are stable. LEFT chest wall pacemaker/ICD apparatus in place. IMPRESSION: New patchy bilateral airspace opacities, lower lobe predominant, most likely pulmonary edema. Pneumonia cannot be excluded if febrile. Electronically Signed   By: Bary Richard M.D.   On: 06/01/2019 13:59   DG Chest Port 1 View  Result Date: 05/31/2019 CLINICAL DATA:  Chest pain, STEMI, ventricular tachycardia, defibrillator discharge EXAM: PORTABLE CHEST 1 VIEW COMPARISON:  Portable exam 2344 hours without prior exams available for comparison FINDINGS: LEFT subclavian AICD with leads projecting over RIGHT atrium and RIGHT ventricle. Normal heart size, mediastinal contours, and pulmonary vascularity. Lungs clear. No infiltrate, pleural  effusion, or pneumothorax. Osseous structures unremarkable. IMPRESSION: No acute abnormalities. Electronically Signed   By: Ulyses Southward M.D.   On: 05/31/2019 23:52   ECHOCARDIOGRAM COMPLETE  Result Date: 06/01/2019    ECHOCARDIOGRAM REPORT   Patient Name:   Rachel Rivers Date of Exam: 06/01/2019 Medical Rec #:  [id number]       Height:       68.0 in Accession #:    [id number]      Weight:       249.2 lb Date of Birth:  [birth year]  BSA:          2.244 m Patient Age:    54 years        BP:           133/87 mmHg Patient Gender: F               HR:           90 bpm. Exam Location:  Inpatient Procedure: 2D Echo, Color Doppler and Cardiac Doppler Indications:    Ventricular tachycardia I47.2  History:        Patient has no prior history of Echocardiogram examinations.                 CAD, Prior CABG and Defibrillator; Risk Factors:Diabetes and                 Hypertension.  Sonographer:    Ross Ludwig RDCS (AE) Referring Phys: [id number] [facility]  Sonographer Comments: Suboptimal subcostal window and patient is morbidly obese. Image acquisition challenging due to patient body habitus. IMPRESSIONS  1. Akinesis of the inferolateral wall with overall mild to moderate LV dysfunction; mild LVH; grade 2 diastolic dysfunction; mild MR; mild LAE; mild TR.  2. Left ventricular ejection fraction, by estimation, is 40 to 45%. The left ventricle has mild to moderately decreased function. The left ventricle demonstrates regional wall motion abnormalities (see scoring diagram/findings for description). There is  mild left ventricular hypertrophy. Left ventricular diastolic parameters are consistent with Grade II diastolic dysfunction (pseudonormalization).  3. Right ventricular systolic function is normal. The right ventricular size is normal.  4. Left atrial size was mildly dilated.  5. The mitral valve is normal in structure. Mild mitral valve regurgitation. No evidence of mitral stenosis.  6. The aortic valve is  tricuspid. Aortic valve regurgitation is not visualized. No aortic stenosis is present. FINDINGS  Left Ventricle: Left ventricular ejection fraction, by estimation, is 40 to 45%. The left ventricle has mild to moderately decreased function. The left ventricle demonstrates regional wall motion abnormalities. The left ventricular internal cavity size was normal in size. There is mild left ventricular hypertrophy. Left ventricular diastolic parameters are consistent with Grade II diastolic dysfunction (pseudonormalization). Right Ventricle: The right ventricular size is normal.Right ventricular systolic function is normal. Left Atrium: Left atrial size was mildly dilated. Right Atrium: Right atrial size was normal in size. Pericardium: There is no evidence of pericardial effusion. Mitral Valve: The mitral valve is normal in structure. Normal mobility of the mitral valve leaflets. Mild mitral annular calcification. Mild mitral valve regurgitation. No evidence of mitral valve stenosis. Tricuspid Valve: The tricuspid valve is normal in structure. Tricuspid valve regurgitation is mild . No evidence of tricuspid stenosis. Aortic Valve: The aortic valve is tricuspid. Aortic valve regurgitation is not visualized. No aortic stenosis is present. Aortic valve mean gradient measures 4.0 mmHg. Aortic valve peak gradient measures 5.7 mmHg. Aortic valve area, by VTI measures 2.65 cm. Pulmonic Valve: The pulmonic valve was normal in structure. Pulmonic valve regurgitation is not visualized. No evidence of pulmonic stenosis. Aorta: The aortic root is normal in size and structure. Venous: The inferior vena cava was not well visualized. IAS/Shunts: No atrial level shunt detected by color flow Doppler. Additional Comments: Akinesis of the inferolateral wall with overall mild to moderate LV dysfunction; mild LVH; grade 2 diastolic dysfunction; mild MR; mild LAE; mild TR. A pacer wire is visualized.  LEFT VENTRICLE PLAX 2D LVIDd:  4.60 cm      Diastology LVIDs:         3.60 cm      LV e' lateral: 11.50 cm/s LV PW:         1.20 cm      LV e' medial:  6.64 cm/s LV IVS:        1.10 cm LVOT diam:     2.20 cm LV SV:         60 LV SV Index:   27 LVOT Area:     3.80 cm  LV Volumes (MOD) LV vol d, MOD A2C: 91.5 ml LV vol d, MOD A4C: 127.0 ml LV vol s, MOD A2C: 53.9 ml LV vol s, MOD A4C: 80.8 ml LV SV MOD A2C:     37.6 ml LV SV MOD A4C:     127.0 ml LV SV MOD BP:      41.8 ml RIGHT VENTRICLE RV Basal diam:  2.50 cm RV S prime:     5.98 cm/s TAPSE (M-mode): 1.3 cm LEFT ATRIUM             Index       RIGHT ATRIUM           Index LA diam:        4.30 cm 1.92 cm/m  RA Area:     13.20 cm LA Vol (A2C):   76.7 ml 34.18 ml/m RA Volume:   28.30 ml  12.61 ml/m LA Vol (A4C):   87.1 ml 38.81 ml/m LA Biplane Vol: 86.1 ml 38.37 ml/m  AORTIC VALVE AV Area (Vmax):    2.32 cm AV Area (Vmean):   2.36 cm AV Area (VTI):     2.65 cm AV Vmax:           119.00 cm/s AV Vmean:          90.200 cm/s AV VTI:            0.228 m AV Peak Grad:      5.7 mmHg AV Mean Grad:      4.0 mmHg LVOT Vmax:         72.60 cm/s LVOT Vmean:        55.900 cm/s LVOT VTI:          0.159 m LVOT/AV VTI ratio: 0.70  AORTA Ao Root diam: 3.10 cm MR Peak grad:    87.6 mmHg   TRICUSPID VALVE MR Mean grad:    60.0 mmHg   TR Peak grad:   34.8 mmHg MR Vmax:         468.00 cm/s TR Vmax:        295.00 cm/s MR Vmean:        366.0 cm/s MR PISA:         3.08 cm    SHUNTS MR PISA Eff ROA: 20 mm      Systemic VTI:  0.16 m MR PISA Radius:  0.70 cm     Systemic Diam: 2.20 cm Olga MillersBrian Crenshaw MD Electronically signed by Olga MillersBrian Crenshaw MD Signature Date/Time: 06/01/2019/11:53:04 AM    Final     Labs: BNP (last 3 results) No results for input(s): BNP in the last 8760 hours. Basic Metabolic Panel: Recent Labs  Lab 05/31/19 2324 06/01/19 0537 06/01/19 1921 06/02/19 0531 06/03/19 0346 06/04/19 0227  NA 138 138  --  136 135 139  K 4.0 4.0  --  4.8 4.5 4.3  CL 103 99  --  95* 97* 99  CO2 21* 26  --   28 28 30   GLUCOSE 148* 142*  --  335* 295* 169*  BUN 25* 28*  --  27* 33* 30*  CREATININE 1.51* 1.49*  --  1.57* 1.55* 1.48*  CALCIUM 8.2* 8.8*  --  8.7* 8.4* 8.5*  MG  --  1.4* 2.1 2.0 2.2 2.0   Liver Function Tests: Recent Labs  Lab 06/03/19 0346  AST 21  ALT 20  ALKPHOS 64  BILITOT 0.5  PROT 5.8*  ALBUMIN 2.8*   No results for input(s): LIPASE, AMYLASE in the last 168 hours. No results for input(s): AMMONIA in the last 168 hours. CBC: Recent Labs  Lab 05/31/19 2324 06/02/19 0531  WBC 9.7 13.4*  NEUTROABS 7.6  --   HGB 10.4* 11.1*  HCT 35.3* 36.2  MCV 91.7 89.8  PLT 298 289   Cardiac Enzymes: No results for input(s): CKTOTAL, CKMB, CKMBINDEX, TROPONINI in the last 168 hours. BNP: Invalid input(s): POCBNP CBG: Recent Labs  Lab 06/03/19 1123 06/03/19 1634 06/03/19 2121 06/04/19 0754 06/04/19 1144  GLUCAP 311* 170* 147* 257* 225*   D-Dimer No results for input(s): DDIMER in the last 72 hours. Hgb A1c No results for input(s): HGBA1C in the last 72 hours. Lipid Profile Recent Labs    06/03/19 0346  CHOL 129  HDL 43  LDLCALC 47  TRIG 197*  CHOLHDL 3.0   Thyroid function studies No results for input(s): TSH, T4TOTAL, T3FREE, THYROIDAB in the last 72 hours.  Invalid input(s): FREET3 Anemia work up No results for input(s): VITAMINB12, FOLATE, FERRITIN, TIBC, IRON, RETICCTPCT in the last 72 hours. Urinalysis No results found for: COLORURINE, APPEARANCEUR, LABSPEC, PHURINE, GLUCOSEU, HGBUR, BILIRUBINUR, KETONESUR, PROTEINUR, UROBILINOGEN, NITRITE, LEUKOCYTESUR Sepsis Labs Invalid input(s): PROCALCITONIN,  WBC,  LACTICIDVEN Microbiology Recent Results (from the past 240 hour(s))  SARS CORONAVIRUS 2 (TAT 6-24 HRS) Nasopharyngeal Nasopharyngeal Swab     Status: None   Collection Time: 06/01/19  4:54 AM   Specimen: Nasopharyngeal Swab  Result Value Ref Range Status   SARS Coronavirus 2 NEGATIVE NEGATIVE Final    Comment: (NOTE) SARS-CoV-2 target  nucleic acids are NOT DETECTED. The SARS-CoV-2 RNA is generally detectable in upper and lower respiratory specimens during the acute phase of infection. Negative results do not preclude SARS-CoV-2 infection, do not rule out co-infections with other pathogens, and should not be used as the sole basis for treatment or other patient management decisions. Negative results must be combined with clinical observations, patient history, and epidemiological information. The expected result is Negative. Fact Sheet for Patients: 08/01/19 Fact Sheet for Healthcare Providers: HairSlick.no This test is not yet approved or cleared by the [place] FDA and  has been authorized for detection and/or diagnosis of SARS-CoV-2 by FDA under an Emergency Use Authorization (EUA). This EUA will remain  in effect (meaning this test can be used) for the duration of the COVID-19 declaration under Section 56 4(b)(1) of the Act, 21 U.S.C. section 360bbb-3(b)(1), unless the authorization is terminated or revoked sooner. Performed at [facility], [address], [place], [place]      Time coordinating discharge: 25 minutes  SIGNED: [id number], MD  [facility] 06/04/2019, 2:22 PM  If 7PM-7AM, please contact night-coverage [email or web address]

## 2019-06-04 NOTE — Progress Notes (Addendum)
Progress Note  Patient Name: Rachel Rivers Date of Encounter: 06/04/2019  Primary Cardiologist: Smitty Cords  Subjective   Feeling much better today, back to her baseline. PT note yesterday did outline some decrease in O2. However, she reports she walked extensively with OT today and did not desat. Has been off O2 since this AM. Await OT note. No CP or SOB.  Inpatient Medications    Scheduled Meds: . acidophilus  1 capsule Oral Daily  . amiodarone  200 mg Oral Daily  . apixaban  5 mg Oral BID  . aspirin EC  81 mg Oral Daily  . atorvastatin  80 mg Oral QHS  . furosemide  20 mg Oral Daily  . insulin aspart  0-5 Units Subcutaneous QHS  . insulin aspart  0-9 Units Subcutaneous TID WC  . insulin aspart  6 Units Subcutaneous TID WC  . insulin glargine  60 Units Subcutaneous Daily  . isosorbide mononitrate  30 mg Oral Daily  . levothyroxine  100 mcg Oral Q0600  . metoprolol  200 mg Oral Daily  . multivitamin with minerals  1 tablet Oral Daily  . pantoprazole  40 mg Oral BID  . PARoxetine  10 mg Oral Daily  . sodium chloride flush  3 mL Intravenous Q12H  . sucralfate  1 g Oral QID   Continuous Infusions: . sodium chloride     PRN Meds: sodium chloride, acetaminophen, diazepam, nitroGLYCERIN, ondansetron (ZOFRAN) IV, polyethylene glycol, sodium chloride flush   Vital Signs    Vitals:   06/03/19 1124 06/03/19 2120 06/04/19 0414 06/04/19 0941  BP: 128/86 125/77 117/75 (!) 166/77  Pulse: 84 90 79 80  Resp:  (!) 21 19   Temp: 97.8 F (36.6 C) 98.4 F (36.9 C) 98 F (36.7 C)   TempSrc: Oral Oral Oral   SpO2: 96% 94% 96%   Weight:   110.8 kg   Height:        Intake/Output Summary (Last 24 hours) at 06/04/2019 1025 Last data filed at 06/04/2019 0800 Gross per 24 hour  Intake 830 ml  Output 1250 ml  Net -420 ml   Last 3 Weights 06/04/2019 06/03/2019 06/02/2019  Weight (lbs) 244 lb 4.8 oz 245 lb 11.2 oz 251 lb  Weight (kg) 110.814 kg 111.449 kg 113.853 kg     Telemetry    NSR,  occasional PVC - Personally Reviewed  Physical Exam   GEN: No acute distress.  HEENT: Normocephalic, atraumatic, sclera non-icteric. Neck: No JVD or bruits. Cardiac: RRR no murmurs, rubs, or gallops.  Radials/DP/PT 1+ and equal bilaterally.  Respiratory: Clear to auscultation bilaterally. Breathing is unlabored. GI: Soft, nontender, non-distended, BS +x 4. MS: no deformity. Extremities: No clubbing or cyanosis. No edema. Distal pedal pulses are 2+ and equal bilaterally. Neuro:  AAOx3. Follows commands. Left chronic facial droop and arm hemiparesis noted. Psych:  Responds to questions appropriately with a normal affect.  Labs    High Sensitivity Troponin:   Recent Labs  Lab 05/31/19 2324 06/01/19 0156 06/01/19 0537 06/01/19 0912 06/01/19 1354  TROPONINIHS 402* 3,603* 6,277* 4,309* 4,271*      Cardiac EnzymesNo results for input(s): TROPONINI in the last 168 hours. No results for input(s): TROPIPOC in the last 168 hours.   Chemistry Recent Labs  Lab 06/02/19 0531 06/03/19 0346 06/04/19 0227  NA 136 135 139  K 4.8 4.5 4.3  CL 95* 97* 99  CO2 28 28 30   GLUCOSE 335* 295* 169*  BUN 27* 33* 30*  CREATININE 1.57* 1.55* 1.48*  CALCIUM 8.7* 8.4* 8.5*  PROT  --  5.8*  --   ALBUMIN  --  2.8*  --   AST  --  21  --   ALT  --  20  --   ALKPHOS  --  64  --   BILITOT  --  0.5  --   GFRNONAA 37* 37* 39*  GFRAA 43* 43* 46*  ANIONGAP 13 10 10      Hematology Recent Labs  Lab 05/31/19 2324 06/02/19 0531  WBC 9.7 13.4*  RBC 3.85* 4.03  HGB 10.4* 11.1*  HCT 35.3* 36.2  MCV 91.7 89.8  MCH 27.0 27.5  MCHC 29.5* 30.7  RDW 17.8* 17.2*  PLT 298 289    BNPNo results for input(s): BNP, PROBNP in the last 168 hours.   DDimer No results for input(s): DDIMER in the last 168 hours.   Radiology    CARDIAC CATHETERIZATION  Result Date: 06/03/2019  Mid RCA to Dist RCA lesion is 100% stenosed.  Prox RCA lesion is 80% stenosed.  Inf Sept lesion is 95% stenosed.  Ost LAD to  Prox LAD lesion is 100% stenosed.  Mid LAD lesion is 80% stenosed.  1st Mrg lesion is 70% stenosed.  Prox Cx lesion is 80% stenosed.  Prox Cx to Mid Cx lesion is 80% stenosed.  Mid Cx lesion is 80% stenosed.  Severe multivessel native CAD with total ostial occlusion of the LAD; 80% stenosis of the proximal circumflex immediately after the takeoff of the OM1 vessel with 70% stenosis in the proximal OM1 and 80% in-stent restenosis in the previously placed circumflex stent with 80% stenosis beyond the stented segment in small caliber mid circumflex vessel; and 80% proximal RCA stenosis with total occlusion of the RCA proximal to the acute margin. Patent LIMA graft supplying the LAD with retrograde filling up to the ostium of the distal LAD extending to around the apex. Patent vein graft supplying the distal RCA.  The PDA is small caliber and has 95% stenosis and a branch vessel. Old occlusion of 2 vein grafts which had supplied the OM1 and OM 2 vessels of the left circumflex artery. LVEDP 19 mmHg RECOMMENDATION: Will review with colleagues.  The patient had previously been documented to have occlusions of her grafts which supplied the circumflex marginal 1 and marginal 2 vessels.  The remaining grafts including the LIMA graft to LAD and RCA are patent.  We will plan an initial attempt at increase medical therapy  and will add beta-blocker and amlodipine to her current regimen.  If she experiences recurrent chest pain symptomatology may consider potential intervention to her native circumflex vessel.    Cardiac Studies   LHC 06/02/19  Mid RCA to Dist RCA lesion is 100% stenosed.  Prox RCA lesion is 80% stenosed.  Inf Sept lesion is 95% stenosed.  Ost LAD to Prox LAD lesion is 100% stenosed.  Mid LAD lesion is 80% stenosed.  1st Mrg lesion is 70% stenosed.  Prox Cx lesion is 80% stenosed.  Prox Cx to Mid Cx lesion is 80% stenosed.  Mid Cx lesion is 80% stenosed.   Severe multivessel native CAD  with total ostial occlusion of the LAD; 80% stenosis of the proximal circumflex immediately after the takeoff of the OM1 vessel with 70% stenosis in the proximal OM1 and 80% in-stent restenosis in the previously placed circumflex stent with 80% stenosis beyond the stented segment in small caliber mid circumflex vessel; and 80% proximal  RCA stenosis with total occlusion of the RCA proximal to the acute margin.  Patent LIMA graft supplying the LAD with retrograde filling up to the ostium of the distal LAD extending to around the apex.  Patent vein graft supplying the distal RCA.  The PDA is small caliber and has 95% stenosis and a branch vessel.  Old occlusion of 2 vein grafts which had supplied the OM1 and OM 2 vessels of the left circumflex artery.  LVEDP 19 mmHg  RECOMMENDATION: Will review with colleagues.  The patient had previously been documented to have occlusions of her grafts which supplied the circumflex marginal 1 and marginal 2 vessels.  The remaining grafts including the LIMA graft to LAD and RCA are patent.  We will plan an initial attempt at increase medical therapy  and will add beta-blocker and amlodipine to her current regimen.  If she experiences recurrent chest pain symptomatology may consider potential intervention to her native circumflex vessel.  2D Echo 06/01/19 1. Akinesis of the inferolateral wall with overall mild to moderate LV  dysfunction; mild LVH; grade 2 diastolic dysfunction; mild MR; mild LAE;  mild TR.  2. Left ventricular ejection fraction, by estimation, is 40 to 45%. The  left ventricle has mild to moderately decreased function. The left  ventricle demonstrates regional wall motion abnormalities (see scoring  diagram/findings for description). There is  mild left ventricular hypertrophy. Left ventricular diastolic parameters  are consistent with Grade II diastolic dysfunction (pseudonormalization).  3. Right ventricular systolic function is normal.  The right ventricular  size is normal.  4. Left atrial size was mildly dilated.  5. The mitral valve is normal in structure. Mild mitral valve  regurgitation. No evidence of mitral stenosis.  6. The aortic valve is tricuspid. Aortic valve regurgitation is not  visualized. No aortic stenosis is present  Patient Profile     53 y.o.femalewith history ofCAD s/p multiple PCIs and 4v CABG 11/2015 (LIMA-LAD, SVG- RCA, SVG-OM1, SVG-OM2), prior gastric bypass, diabetes, VT on amiodarone with [employer] ICD in place, PAT,HTN, anemia, GIB, CKD stage III by labs (Cr 1.05-1.3 by outside records), ICM/chronic systolic CHF (EF referenced at 45-50% 11/2017 in [facility])whowas admitted with multiple ICD shocks. Per outside note from 10/2018 she has history of ICD shock 07/2018 for atrial tachycardia and sinus tachycardia with LBBB in context of viral illness. They indicate she is on Eliquis for prior history of stroke. Per EP note 06/01/19 from Dr. Elberta Fortis, ICD interrogation showed that ICD shocks were inappropriate from atrial arrhythmias so changes were made to programmingto avoid inappropriate shock. Given elevated troponin, underwent cardiac cath 06/02/19 with findings above - medical therapy recommended.  Assessment & Plan    1. ICD shocks due to atrial arrhythmias -per Dr. Elberta Fortis, shock was due to a 1:1 atrial arrhythmia, likely atrial tach. Device reprogrammed to avoid inappropriate therapies. He recommended continuing prior home regimen which includes amiodarone 200mg  and metoprolol 200mg  daily and follow-up with primary cardiologist in [place] system.See #6 re: TSH.    2. Elevated troponin/possible NSTEMI in context of history of CAD - peak troponin 6277. LHC 06/02/19 as outlined above, patent LIMA-LAD, VG-dLAD, old occluded 2 VG. Medical therapy recommended, can consider PCI down the road if angina recurs despite medical therapy. Imdur added to regimen and she feels great. Continue BB and  statin. Lipids show good control with LDL of 47.  3. Acute on chronic combined CHF - EF 40-45% with akinesis of the inferolateral wall, grade 2 DD, normal  RV, mild MR. On 06/01/19 she developed chest pain, hypoxia requiring NRB, and pulmonary edema on CXR yesterday in setting of holding of diuretics (for her kidney function). LVEDP was . Lasix resumed yesterday at home dose. She feels good. With slightly elevated renal function this admission would hold off resuming home HCTZ. Can consider spironolactone as outpatient given her decreased EF if additional diuretic control needed.  4. AKI on CKD stage III - prior Cr noted in [facility] of 1.05-1.30. This admission, 1.51->1.47->1.57->1.55->1.48. Will need OP monitoring.   5. History of stroke- per outside cardiology notes, on Eliquis for this. Chronic left hemiparesis reported. Do not see overt hx of atrial fib/flutter. OK to resume Eliquis this AM.  6. Abnormal thyroid function - TSH elevated at 7.791 and free T4 is also elevated. Further management per IM. D/w Dr. Elberta Fortis - he would not recommend any changes to amiodarone acutely. Plan to recheck as outpatient.  7. Hypomagnesmia - Mg 1.4 on arrival, repleted and now maintaining at normal level.  8. Hypoxia - PT notes indicated yesterday that she dropped into the low 80s upon return to the room. She has been off O2 since 6:30am today. She reports she walked with OT today extensively without any desaturations. Confirmed this with OT - their formal note is pending.  Dispo - stable from cardiac standpoint, anticipate OK to discharge. Will review final med plan with MD. Recommend she f/u with her primary cardiologist at [facility] in 1 week. She will call them to schedule.  For questions or updates, please contact [facility] Please consult [email or web address] for contact info under Cardiology/STEMI.  Signed, Laurann Montana, PA-C 06/04/2019, 10:25 AM    Patient seen and examined with Ronie Spies, PA-C.  Agree as above, with the following exceptions and changes as noted below.  Patient is motivated to improve her diet and we had a long discussion about dietary changes and cooking her own food.  She plans to start a blog for healthy eating choices.  She has no chest pain today, and feels she has reached euvolemia.  Gen: NAD, CV: RRR, no murmurs, Lungs: clear, Abd: soft, Extrem: Warm, well perfused, no edema, Neuro/Psych: alert and oriented x 3, normal mood and affect. All available labs, radiology testing, previous records reviewed.  Stable from an EP standpoint, can restart her quinapril today given that she is hypertensive.  Parke Poisson 06/04/19 3:11 PM

## 2019-06-09 DIAGNOSIS — I480 Paroxysmal atrial fibrillation: Secondary | ICD-10-CM | POA: Diagnosis not present

## 2019-06-09 DIAGNOSIS — I2510 Atherosclerotic heart disease of native coronary artery without angina pectoris: Secondary | ICD-10-CM | POA: Diagnosis not present

## 2019-06-09 DIAGNOSIS — I255 Ischemic cardiomyopathy: Secondary | ICD-10-CM | POA: Diagnosis not present

## 2019-06-09 DIAGNOSIS — Z4502 Encounter for adjustment and management of automatic implantable cardiac defibrillator: Secondary | ICD-10-CM | POA: Diagnosis not present

## 2019-06-09 DIAGNOSIS — I10 Essential (primary) hypertension: Secondary | ICD-10-CM | POA: Diagnosis not present

## 2019-06-11 DIAGNOSIS — E1165 Type 2 diabetes mellitus with hyperglycemia: Secondary | ICD-10-CM | POA: Diagnosis not present

## 2019-06-11 DIAGNOSIS — D509 Iron deficiency anemia, unspecified: Secondary | ICD-10-CM | POA: Diagnosis not present

## 2019-06-11 DIAGNOSIS — I2510 Atherosclerotic heart disease of native coronary artery without angina pectoris: Secondary | ICD-10-CM | POA: Diagnosis not present

## 2019-06-11 DIAGNOSIS — N179 Acute kidney failure, unspecified: Secondary | ICD-10-CM | POA: Diagnosis not present

## 2019-06-11 DIAGNOSIS — E039 Hypothyroidism, unspecified: Secondary | ICD-10-CM | POA: Diagnosis not present

## 2019-06-11 DIAGNOSIS — I472 Ventricular tachycardia: Secondary | ICD-10-CM | POA: Diagnosis not present

## 2019-06-11 DIAGNOSIS — I214 Non-ST elevation (NSTEMI) myocardial infarction: Secondary | ICD-10-CM | POA: Diagnosis not present

## 2019-06-11 DIAGNOSIS — I69354 Hemiplegia and hemiparesis following cerebral infarction affecting left non-dominant side: Secondary | ICD-10-CM | POA: Diagnosis not present

## 2019-06-11 DIAGNOSIS — E1122 Type 2 diabetes mellitus with diabetic chronic kidney disease: Secondary | ICD-10-CM | POA: Diagnosis not present

## 2019-06-11 DIAGNOSIS — I10 Essential (primary) hypertension: Secondary | ICD-10-CM | POA: Diagnosis not present

## 2019-06-11 DIAGNOSIS — I255 Ischemic cardiomyopathy: Secondary | ICD-10-CM | POA: Diagnosis not present

## 2019-06-11 DIAGNOSIS — I5043 Acute on chronic combined systolic (congestive) and diastolic (congestive) heart failure: Secondary | ICD-10-CM | POA: Diagnosis not present

## 2019-06-26 DIAGNOSIS — R931 Abnormal findings on diagnostic imaging of heart and coronary circulation: Secondary | ICD-10-CM | POA: Diagnosis not present

## 2019-06-26 DIAGNOSIS — N183 Chronic kidney disease, stage 3 unspecified: Secondary | ICD-10-CM | POA: Diagnosis not present

## 2019-06-26 DIAGNOSIS — I69354 Hemiplegia and hemiparesis following cerebral infarction affecting left non-dominant side: Secondary | ICD-10-CM | POA: Diagnosis not present

## 2019-06-26 DIAGNOSIS — R0602 Shortness of breath: Secondary | ICD-10-CM | POA: Diagnosis not present

## 2019-06-26 DIAGNOSIS — I4891 Unspecified atrial fibrillation: Secondary | ICD-10-CM | POA: Diagnosis not present

## 2019-06-26 DIAGNOSIS — E119 Type 2 diabetes mellitus without complications: Secondary | ICD-10-CM | POA: Diagnosis not present

## 2019-06-26 DIAGNOSIS — R918 Other nonspecific abnormal finding of lung field: Secondary | ICD-10-CM | POA: Diagnosis not present

## 2019-06-26 DIAGNOSIS — I130 Hypertensive heart and chronic kidney disease with heart failure and stage 1 through stage 4 chronic kidney disease, or unspecified chronic kidney disease: Secondary | ICD-10-CM | POA: Diagnosis not present

## 2019-06-26 DIAGNOSIS — I509 Heart failure, unspecified: Secondary | ICD-10-CM | POA: Diagnosis not present

## 2019-06-26 DIAGNOSIS — Z8673 Personal history of transient ischemic attack (TIA), and cerebral infarction without residual deficits: Secondary | ICD-10-CM | POA: Diagnosis not present

## 2019-06-26 DIAGNOSIS — E872 Acidosis: Secondary | ICD-10-CM | POA: Diagnosis not present

## 2019-06-26 DIAGNOSIS — R0789 Other chest pain: Secondary | ICD-10-CM | POA: Diagnosis not present

## 2019-06-26 DIAGNOSIS — Z951 Presence of aortocoronary bypass graft: Secondary | ICD-10-CM | POA: Diagnosis not present

## 2019-06-26 DIAGNOSIS — I5043 Acute on chronic combined systolic (congestive) and diastolic (congestive) heart failure: Secondary | ICD-10-CM | POA: Diagnosis not present

## 2019-06-26 DIAGNOSIS — I348 Other nonrheumatic mitral valve disorders: Secondary | ICD-10-CM | POA: Diagnosis not present

## 2019-06-26 DIAGNOSIS — E1165 Type 2 diabetes mellitus with hyperglycemia: Secondary | ICD-10-CM | POA: Diagnosis not present

## 2019-06-26 DIAGNOSIS — I213 ST elevation (STEMI) myocardial infarction of unspecified site: Secondary | ICD-10-CM | POA: Diagnosis not present

## 2019-06-26 DIAGNOSIS — I472 Ventricular tachycardia: Secondary | ICD-10-CM | POA: Diagnosis not present

## 2019-06-26 DIAGNOSIS — Z20822 Contact with and (suspected) exposure to covid-19: Secondary | ICD-10-CM | POA: Diagnosis not present

## 2019-06-26 DIAGNOSIS — Z95810 Presence of automatic (implantable) cardiac defibrillator: Secondary | ICD-10-CM | POA: Diagnosis not present

## 2019-06-26 DIAGNOSIS — J9601 Acute respiratory failure with hypoxia: Secondary | ICD-10-CM | POA: Diagnosis not present

## 2019-06-26 DIAGNOSIS — I255 Ischemic cardiomyopathy: Secondary | ICD-10-CM | POA: Diagnosis not present

## 2019-06-26 DIAGNOSIS — Z950 Presence of cardiac pacemaker: Secondary | ICD-10-CM | POA: Diagnosis not present

## 2019-06-26 DIAGNOSIS — E785 Hyperlipidemia, unspecified: Secondary | ICD-10-CM | POA: Diagnosis not present

## 2019-06-26 DIAGNOSIS — I129 Hypertensive chronic kidney disease with stage 1 through stage 4 chronic kidney disease, or unspecified chronic kidney disease: Secondary | ICD-10-CM | POA: Diagnosis not present

## 2019-06-26 DIAGNOSIS — I471 Supraventricular tachycardia: Secondary | ICD-10-CM | POA: Diagnosis not present

## 2019-06-26 DIAGNOSIS — E1122 Type 2 diabetes mellitus with diabetic chronic kidney disease: Secondary | ICD-10-CM | POA: Diagnosis not present

## 2019-06-26 DIAGNOSIS — J9811 Atelectasis: Secondary | ICD-10-CM | POA: Diagnosis not present

## 2019-06-26 DIAGNOSIS — R000 Tachycardia, unspecified: Secondary | ICD-10-CM | POA: Diagnosis not present

## 2019-06-26 DIAGNOSIS — I10 Essential (primary) hypertension: Secondary | ICD-10-CM | POA: Diagnosis not present

## 2019-06-26 DIAGNOSIS — N179 Acute kidney failure, unspecified: Secondary | ICD-10-CM | POA: Diagnosis not present

## 2019-06-26 DIAGNOSIS — I2510 Atherosclerotic heart disease of native coronary artery without angina pectoris: Secondary | ICD-10-CM | POA: Diagnosis not present

## 2019-06-26 DIAGNOSIS — J810 Acute pulmonary edema: Secondary | ICD-10-CM | POA: Diagnosis not present

## 2019-06-26 DIAGNOSIS — I517 Cardiomegaly: Secondary | ICD-10-CM | POA: Diagnosis not present

## 2019-06-26 DIAGNOSIS — K219 Gastro-esophageal reflux disease without esophagitis: Secondary | ICD-10-CM | POA: Diagnosis not present

## 2019-06-26 DIAGNOSIS — I480 Paroxysmal atrial fibrillation: Secondary | ICD-10-CM | POA: Diagnosis not present

## 2019-06-26 DIAGNOSIS — I5189 Other ill-defined heart diseases: Secondary | ICD-10-CM | POA: Diagnosis not present

## 2019-06-26 DIAGNOSIS — E1140 Type 2 diabetes mellitus with diabetic neuropathy, unspecified: Secondary | ICD-10-CM | POA: Diagnosis not present

## 2019-06-26 DIAGNOSIS — I499 Cardiac arrhythmia, unspecified: Secondary | ICD-10-CM | POA: Diagnosis not present

## 2019-07-03 DIAGNOSIS — R000 Tachycardia, unspecified: Secondary | ICD-10-CM | POA: Diagnosis not present

## 2019-07-04 DIAGNOSIS — I255 Ischemic cardiomyopathy: Secondary | ICD-10-CM | POA: Diagnosis not present

## 2019-07-04 DIAGNOSIS — I2510 Atherosclerotic heart disease of native coronary artery without angina pectoris: Secondary | ICD-10-CM | POA: Diagnosis not present

## 2019-07-04 DIAGNOSIS — Z95810 Presence of automatic (implantable) cardiac defibrillator: Secondary | ICD-10-CM | POA: Diagnosis not present

## 2019-07-04 DIAGNOSIS — I472 Ventricular tachycardia: Secondary | ICD-10-CM | POA: Diagnosis not present

## 2019-07-04 DIAGNOSIS — I5043 Acute on chronic combined systolic (congestive) and diastolic (congestive) heart failure: Secondary | ICD-10-CM | POA: Diagnosis not present

## 2019-07-04 DIAGNOSIS — I471 Supraventricular tachycardia: Secondary | ICD-10-CM | POA: Diagnosis not present

## 2019-07-04 DIAGNOSIS — I69354 Hemiplegia and hemiparesis following cerebral infarction affecting left non-dominant side: Secondary | ICD-10-CM | POA: Diagnosis not present

## 2019-07-04 DIAGNOSIS — E1122 Type 2 diabetes mellitus with diabetic chronic kidney disease: Secondary | ICD-10-CM | POA: Diagnosis not present

## 2019-07-11 DIAGNOSIS — I4891 Unspecified atrial fibrillation: Secondary | ICD-10-CM | POA: Diagnosis not present

## 2019-07-11 DIAGNOSIS — Z79899 Other long term (current) drug therapy: Secondary | ICD-10-CM | POA: Diagnosis not present

## 2019-07-11 DIAGNOSIS — R942 Abnormal results of pulmonary function studies: Secondary | ICD-10-CM | POA: Diagnosis not present

## 2019-08-03 DIAGNOSIS — N179 Acute kidney failure, unspecified: Secondary | ICD-10-CM | POA: Diagnosis not present

## 2019-08-03 DIAGNOSIS — I10 Essential (primary) hypertension: Secondary | ICD-10-CM | POA: Diagnosis not present

## 2019-08-03 DIAGNOSIS — E039 Hypothyroidism, unspecified: Secondary | ICD-10-CM | POA: Diagnosis not present

## 2019-08-03 DIAGNOSIS — I472 Ventricular tachycardia: Secondary | ICD-10-CM | POA: Diagnosis not present

## 2019-08-03 DIAGNOSIS — J9601 Acute respiratory failure with hypoxia: Secondary | ICD-10-CM | POA: Diagnosis not present

## 2019-08-03 DIAGNOSIS — I255 Ischemic cardiomyopathy: Secondary | ICD-10-CM | POA: Diagnosis not present

## 2019-08-03 DIAGNOSIS — I5043 Acute on chronic combined systolic (congestive) and diastolic (congestive) heart failure: Secondary | ICD-10-CM | POA: Diagnosis not present

## 2019-08-03 DIAGNOSIS — E872 Acidosis: Secondary | ICD-10-CM | POA: Diagnosis not present

## 2019-08-13 DIAGNOSIS — I472 Ventricular tachycardia: Secondary | ICD-10-CM | POA: Diagnosis not present

## 2019-09-10 DIAGNOSIS — I10 Essential (primary) hypertension: Secondary | ICD-10-CM | POA: Diagnosis not present

## 2019-09-10 DIAGNOSIS — I472 Ventricular tachycardia: Secondary | ICD-10-CM | POA: Diagnosis not present

## 2019-09-10 DIAGNOSIS — I255 Ischemic cardiomyopathy: Secondary | ICD-10-CM | POA: Diagnosis not present

## 2019-09-10 DIAGNOSIS — I471 Supraventricular tachycardia: Secondary | ICD-10-CM | POA: Diagnosis not present

## 2019-10-08 DIAGNOSIS — I255 Ischemic cardiomyopathy: Secondary | ICD-10-CM | POA: Diagnosis not present

## 2019-10-08 DIAGNOSIS — Z951 Presence of aortocoronary bypass graft: Secondary | ICD-10-CM | POA: Diagnosis not present

## 2019-10-08 DIAGNOSIS — I479 Paroxysmal tachycardia, unspecified: Secondary | ICD-10-CM | POA: Diagnosis not present

## 2019-10-08 DIAGNOSIS — I472 Ventricular tachycardia: Secondary | ICD-10-CM | POA: Diagnosis not present

## 2019-10-08 DIAGNOSIS — Z79899 Other long term (current) drug therapy: Secondary | ICD-10-CM | POA: Diagnosis not present

## 2019-10-08 DIAGNOSIS — I471 Supraventricular tachycardia: Secondary | ICD-10-CM | POA: Diagnosis not present

## 2019-10-10 DIAGNOSIS — I10 Essential (primary) hypertension: Secondary | ICD-10-CM | POA: Diagnosis not present

## 2019-10-10 DIAGNOSIS — Z6835 Body mass index (BMI) 35.0-35.9, adult: Secondary | ICD-10-CM | POA: Diagnosis not present

## 2019-10-10 DIAGNOSIS — I5043 Acute on chronic combined systolic (congestive) and diastolic (congestive) heart failure: Secondary | ICD-10-CM | POA: Diagnosis not present

## 2019-10-10 DIAGNOSIS — I252 Old myocardial infarction: Secondary | ICD-10-CM | POA: Diagnosis not present

## 2019-10-10 DIAGNOSIS — Z0001 Encounter for general adult medical examination with abnormal findings: Secondary | ICD-10-CM | POA: Diagnosis not present

## 2019-10-10 DIAGNOSIS — I5022 Chronic systolic (congestive) heart failure: Secondary | ICD-10-CM | POA: Diagnosis not present

## 2019-10-10 DIAGNOSIS — N183 Chronic kidney disease, stage 3 unspecified: Secondary | ICD-10-CM | POA: Diagnosis not present

## 2019-10-10 DIAGNOSIS — E1165 Type 2 diabetes mellitus with hyperglycemia: Secondary | ICD-10-CM | POA: Diagnosis not present

## 2019-10-14 DIAGNOSIS — E1165 Type 2 diabetes mellitus with hyperglycemia: Secondary | ICD-10-CM | POA: Diagnosis not present

## 2019-10-14 DIAGNOSIS — E7800 Pure hypercholesterolemia, unspecified: Secondary | ICD-10-CM | POA: Diagnosis not present

## 2019-10-14 DIAGNOSIS — E039 Hypothyroidism, unspecified: Secondary | ICD-10-CM | POA: Diagnosis not present

## 2019-10-14 DIAGNOSIS — I10 Essential (primary) hypertension: Secondary | ICD-10-CM | POA: Diagnosis not present

## 2019-10-14 DIAGNOSIS — K219 Gastro-esophageal reflux disease without esophagitis: Secondary | ICD-10-CM | POA: Diagnosis not present

## 2019-10-14 DIAGNOSIS — D519 Vitamin B12 deficiency anemia, unspecified: Secondary | ICD-10-CM | POA: Diagnosis not present

## 2019-10-15 DIAGNOSIS — I255 Ischemic cardiomyopathy: Secondary | ICD-10-CM | POA: Diagnosis not present

## 2019-10-15 DIAGNOSIS — I472 Ventricular tachycardia: Secondary | ICD-10-CM | POA: Diagnosis not present

## 2019-10-15 DIAGNOSIS — E872 Acidosis: Secondary | ICD-10-CM | POA: Diagnosis not present

## 2019-10-15 DIAGNOSIS — Z6836 Body mass index (BMI) 36.0-36.9, adult: Secondary | ICD-10-CM | POA: Diagnosis not present

## 2019-10-15 DIAGNOSIS — N183 Chronic kidney disease, stage 3 unspecified: Secondary | ICD-10-CM | POA: Diagnosis not present

## 2019-10-15 DIAGNOSIS — I471 Supraventricular tachycardia: Secondary | ICD-10-CM | POA: Diagnosis not present

## 2019-10-15 DIAGNOSIS — I10 Essential (primary) hypertension: Secondary | ICD-10-CM | POA: Diagnosis not present

## 2019-10-15 DIAGNOSIS — I5043 Acute on chronic combined systolic (congestive) and diastolic (congestive) heart failure: Secondary | ICD-10-CM | POA: Diagnosis not present

## 2019-10-23 DIAGNOSIS — E1165 Type 2 diabetes mellitus with hyperglycemia: Secondary | ICD-10-CM | POA: Diagnosis not present

## 2019-11-14 DIAGNOSIS — E119 Type 2 diabetes mellitus without complications: Secondary | ICD-10-CM | POA: Diagnosis not present

## 2019-11-14 DIAGNOSIS — H524 Presbyopia: Secondary | ICD-10-CM | POA: Diagnosis not present

## 2019-11-21 DIAGNOSIS — E1165 Type 2 diabetes mellitus with hyperglycemia: Secondary | ICD-10-CM | POA: Diagnosis not present

## 2019-11-26 DIAGNOSIS — E1140 Type 2 diabetes mellitus with diabetic neuropathy, unspecified: Secondary | ICD-10-CM | POA: Diagnosis not present

## 2019-11-26 DIAGNOSIS — E1169 Type 2 diabetes mellitus with other specified complication: Secondary | ICD-10-CM | POA: Diagnosis not present

## 2019-11-26 DIAGNOSIS — Z794 Long term (current) use of insulin: Secondary | ICD-10-CM | POA: Diagnosis not present

## 2019-11-26 DIAGNOSIS — I10 Essential (primary) hypertension: Secondary | ICD-10-CM | POA: Diagnosis not present

## 2019-11-26 DIAGNOSIS — E1165 Type 2 diabetes mellitus with hyperglycemia: Secondary | ICD-10-CM | POA: Diagnosis not present

## 2019-11-26 DIAGNOSIS — E1122 Type 2 diabetes mellitus with diabetic chronic kidney disease: Secondary | ICD-10-CM | POA: Diagnosis not present

## 2019-11-26 DIAGNOSIS — E785 Hyperlipidemia, unspecified: Secondary | ICD-10-CM | POA: Diagnosis not present

## 2019-11-26 DIAGNOSIS — N183 Chronic kidney disease, stage 3 unspecified: Secondary | ICD-10-CM | POA: Diagnosis not present

## 2019-11-26 DIAGNOSIS — E11319 Type 2 diabetes mellitus with unspecified diabetic retinopathy without macular edema: Secondary | ICD-10-CM | POA: Diagnosis not present

## 2019-11-28 DIAGNOSIS — I5043 Acute on chronic combined systolic (congestive) and diastolic (congestive) heart failure: Secondary | ICD-10-CM | POA: Diagnosis not present

## 2019-11-28 DIAGNOSIS — I480 Paroxysmal atrial fibrillation: Secondary | ICD-10-CM | POA: Diagnosis not present

## 2019-11-28 DIAGNOSIS — I472 Ventricular tachycardia: Secondary | ICD-10-CM | POA: Diagnosis not present

## 2019-11-28 DIAGNOSIS — I10 Essential (primary) hypertension: Secondary | ICD-10-CM | POA: Diagnosis not present

## 2019-11-28 DIAGNOSIS — I2510 Atherosclerotic heart disease of native coronary artery without angina pectoris: Secondary | ICD-10-CM | POA: Diagnosis not present

## 2019-12-01 DIAGNOSIS — Z794 Long term (current) use of insulin: Secondary | ICD-10-CM | POA: Diagnosis not present

## 2019-12-01 DIAGNOSIS — E1165 Type 2 diabetes mellitus with hyperglycemia: Secondary | ICD-10-CM | POA: Diagnosis not present

## 2019-12-01 DIAGNOSIS — E1122 Type 2 diabetes mellitus with diabetic chronic kidney disease: Secondary | ICD-10-CM | POA: Diagnosis not present

## 2019-12-01 DIAGNOSIS — N183 Chronic kidney disease, stage 3 unspecified: Secondary | ICD-10-CM | POA: Diagnosis not present

## 2019-12-30 DIAGNOSIS — K219 Gastro-esophageal reflux disease without esophagitis: Secondary | ICD-10-CM | POA: Diagnosis not present

## 2019-12-30 DIAGNOSIS — I10 Essential (primary) hypertension: Secondary | ICD-10-CM | POA: Diagnosis not present

## 2019-12-30 DIAGNOSIS — K279 Peptic ulcer, site unspecified, unspecified as acute or chronic, without hemorrhage or perforation: Secondary | ICD-10-CM | POA: Diagnosis not present

## 2020-01-21 DIAGNOSIS — E1165 Type 2 diabetes mellitus with hyperglycemia: Secondary | ICD-10-CM | POA: Diagnosis not present

## 2020-04-10 DIAGNOSIS — I712 Thoracic aortic aneurysm, without rupture: Secondary | ICD-10-CM | POA: Diagnosis not present

## 2020-04-10 DIAGNOSIS — I420 Dilated cardiomyopathy: Secondary | ICD-10-CM | POA: Diagnosis not present

## 2020-04-10 DIAGNOSIS — E1165 Type 2 diabetes mellitus with hyperglycemia: Secondary | ICD-10-CM | POA: Diagnosis not present

## 2020-04-10 DIAGNOSIS — I10 Essential (primary) hypertension: Secondary | ICD-10-CM | POA: Diagnosis not present

## 2020-04-10 DIAGNOSIS — R7989 Other specified abnormal findings of blood chemistry: Secondary | ICD-10-CM | POA: Diagnosis not present

## 2020-04-10 DIAGNOSIS — Z951 Presence of aortocoronary bypass graft: Secondary | ICD-10-CM | POA: Diagnosis not present

## 2020-04-10 DIAGNOSIS — I5023 Acute on chronic systolic (congestive) heart failure: Secondary | ICD-10-CM | POA: Diagnosis not present

## 2020-04-10 DIAGNOSIS — I480 Paroxysmal atrial fibrillation: Secondary | ICD-10-CM | POA: Diagnosis not present

## 2020-04-10 DIAGNOSIS — R0602 Shortness of breath: Secondary | ICD-10-CM | POA: Diagnosis not present

## 2020-04-10 DIAGNOSIS — I130 Hypertensive heart and chronic kidney disease with heart failure and stage 1 through stage 4 chronic kidney disease, or unspecified chronic kidney disease: Secondary | ICD-10-CM | POA: Diagnosis not present

## 2020-04-10 DIAGNOSIS — N179 Acute kidney failure, unspecified: Secondary | ICD-10-CM | POA: Diagnosis not present

## 2020-04-10 DIAGNOSIS — E1122 Type 2 diabetes mellitus with diabetic chronic kidney disease: Secondary | ICD-10-CM | POA: Diagnosis not present

## 2020-04-10 DIAGNOSIS — R072 Precordial pain: Secondary | ICD-10-CM | POA: Diagnosis not present

## 2020-04-10 DIAGNOSIS — R918 Other nonspecific abnormal finding of lung field: Secondary | ICD-10-CM | POA: Diagnosis not present

## 2020-04-10 DIAGNOSIS — R079 Chest pain, unspecified: Secondary | ICD-10-CM | POA: Diagnosis not present

## 2020-04-10 DIAGNOSIS — Z20822 Contact with and (suspected) exposure to covid-19: Secondary | ICD-10-CM | POA: Diagnosis not present

## 2020-04-10 DIAGNOSIS — J479 Bronchiectasis, uncomplicated: Secondary | ICD-10-CM | POA: Diagnosis not present

## 2020-04-10 DIAGNOSIS — N183 Chronic kidney disease, stage 3 unspecified: Secondary | ICD-10-CM | POA: Diagnosis not present

## 2020-04-10 DIAGNOSIS — D509 Iron deficiency anemia, unspecified: Secondary | ICD-10-CM | POA: Diagnosis not present

## 2020-04-10 DIAGNOSIS — I517 Cardiomegaly: Secondary | ICD-10-CM | POA: Diagnosis not present

## 2020-04-11 DIAGNOSIS — L03116 Cellulitis of left lower limb: Secondary | ICD-10-CM | POA: Diagnosis not present

## 2020-04-11 DIAGNOSIS — I700 Atherosclerosis of aorta: Secondary | ICD-10-CM | POA: Diagnosis not present

## 2020-04-11 DIAGNOSIS — Z794 Long term (current) use of insulin: Secondary | ICD-10-CM | POA: Diagnosis not present

## 2020-04-11 DIAGNOSIS — R0600 Dyspnea, unspecified: Secondary | ICD-10-CM | POA: Diagnosis not present

## 2020-04-11 DIAGNOSIS — I480 Paroxysmal atrial fibrillation: Secondary | ICD-10-CM | POA: Diagnosis not present

## 2020-04-11 DIAGNOSIS — R079 Chest pain, unspecified: Secondary | ICD-10-CM | POA: Diagnosis not present

## 2020-04-11 DIAGNOSIS — I5023 Acute on chronic systolic (congestive) heart failure: Secondary | ICD-10-CM | POA: Diagnosis not present

## 2020-04-11 DIAGNOSIS — K449 Diaphragmatic hernia without obstruction or gangrene: Secondary | ICD-10-CM | POA: Diagnosis not present

## 2020-04-11 DIAGNOSIS — E1165 Type 2 diabetes mellitus with hyperglycemia: Secondary | ICD-10-CM | POA: Diagnosis not present

## 2020-04-11 DIAGNOSIS — N1832 Chronic kidney disease, stage 3b: Secondary | ICD-10-CM | POA: Diagnosis not present

## 2020-04-11 DIAGNOSIS — I2510 Atherosclerotic heart disease of native coronary artery without angina pectoris: Secondary | ICD-10-CM | POA: Diagnosis not present

## 2020-04-11 DIAGNOSIS — E871 Hypo-osmolality and hyponatremia: Secondary | ICD-10-CM | POA: Diagnosis not present

## 2020-04-11 DIAGNOSIS — N183 Chronic kidney disease, stage 3 unspecified: Secondary | ICD-10-CM | POA: Diagnosis not present

## 2020-04-12 DIAGNOSIS — I2510 Atherosclerotic heart disease of native coronary artery without angina pectoris: Secondary | ICD-10-CM | POA: Diagnosis not present

## 2020-04-12 DIAGNOSIS — N183 Chronic kidney disease, stage 3 unspecified: Secondary | ICD-10-CM | POA: Diagnosis not present

## 2020-04-12 DIAGNOSIS — R079 Chest pain, unspecified: Secondary | ICD-10-CM | POA: Diagnosis not present

## 2020-04-12 DIAGNOSIS — I5023 Acute on chronic systolic (congestive) heart failure: Secondary | ICD-10-CM | POA: Diagnosis not present

## 2020-04-12 DIAGNOSIS — Z794 Long term (current) use of insulin: Secondary | ICD-10-CM | POA: Diagnosis not present

## 2020-04-12 DIAGNOSIS — L03116 Cellulitis of left lower limb: Secondary | ICD-10-CM | POA: Diagnosis not present

## 2020-04-12 DIAGNOSIS — E1165 Type 2 diabetes mellitus with hyperglycemia: Secondary | ICD-10-CM | POA: Diagnosis not present

## 2020-04-26 DIAGNOSIS — E1165 Type 2 diabetes mellitus with hyperglycemia: Secondary | ICD-10-CM | POA: Diagnosis not present

## 2020-05-14 DIAGNOSIS — Z09 Encounter for follow-up examination after completed treatment for conditions other than malignant neoplasm: Secondary | ICD-10-CM | POA: Diagnosis not present

## 2020-05-14 DIAGNOSIS — I509 Heart failure, unspecified: Secondary | ICD-10-CM | POA: Diagnosis not present

## 2020-05-14 DIAGNOSIS — Z0189 Encounter for other specified special examinations: Secondary | ICD-10-CM | POA: Diagnosis not present

## 2020-05-31 DIAGNOSIS — E611 Iron deficiency: Secondary | ICD-10-CM | POA: Diagnosis not present

## 2020-05-31 DIAGNOSIS — R5383 Other fatigue: Secondary | ICD-10-CM | POA: Diagnosis not present

## 2020-08-06 DIAGNOSIS — S0101XA Laceration without foreign body of scalp, initial encounter: Secondary | ICD-10-CM | POA: Diagnosis not present

## 2020-08-06 DIAGNOSIS — Z7984 Long term (current) use of oral hypoglycemic drugs: Secondary | ICD-10-CM | POA: Diagnosis not present

## 2020-08-06 DIAGNOSIS — Z7902 Long term (current) use of antithrombotics/antiplatelets: Secondary | ICD-10-CM | POA: Diagnosis not present

## 2020-08-06 DIAGNOSIS — D649 Anemia, unspecified: Secondary | ICD-10-CM | POA: Diagnosis not present

## 2020-08-06 DIAGNOSIS — I951 Orthostatic hypotension: Secondary | ICD-10-CM | POA: Diagnosis not present

## 2020-08-06 DIAGNOSIS — I2510 Atherosclerotic heart disease of native coronary artery without angina pectoris: Secondary | ICD-10-CM | POA: Diagnosis not present

## 2020-08-06 DIAGNOSIS — Z794 Long term (current) use of insulin: Secondary | ICD-10-CM | POA: Diagnosis not present

## 2020-08-06 DIAGNOSIS — W01198A Fall on same level from slipping, tripping and stumbling with subsequent striking against other object, initial encounter: Secondary | ICD-10-CM | POA: Diagnosis not present

## 2020-08-06 DIAGNOSIS — Z7982 Long term (current) use of aspirin: Secondary | ICD-10-CM | POA: Diagnosis not present

## 2020-08-06 DIAGNOSIS — Z888 Allergy status to other drugs, medicaments and biological substances status: Secondary | ICD-10-CM | POA: Diagnosis not present

## 2020-08-06 DIAGNOSIS — S0990XA Unspecified injury of head, initial encounter: Secondary | ICD-10-CM | POA: Diagnosis not present

## 2020-08-09 DIAGNOSIS — Z794 Long term (current) use of insulin: Secondary | ICD-10-CM | POA: Diagnosis not present

## 2020-08-09 DIAGNOSIS — Z09 Encounter for follow-up examination after completed treatment for conditions other than malignant neoplasm: Secondary | ICD-10-CM | POA: Diagnosis not present

## 2020-08-09 DIAGNOSIS — E119 Type 2 diabetes mellitus without complications: Secondary | ICD-10-CM | POA: Diagnosis not present

## 2020-09-19 DIAGNOSIS — R072 Precordial pain: Secondary | ICD-10-CM | POA: Diagnosis not present

## 2020-09-19 DIAGNOSIS — Z91041 Radiographic dye allergy status: Secondary | ICD-10-CM | POA: Diagnosis not present

## 2020-09-19 DIAGNOSIS — I509 Heart failure, unspecified: Secondary | ICD-10-CM | POA: Diagnosis not present

## 2020-09-19 DIAGNOSIS — E119 Type 2 diabetes mellitus without complications: Secondary | ICD-10-CM | POA: Diagnosis not present

## 2020-09-19 DIAGNOSIS — R4182 Altered mental status, unspecified: Secondary | ICD-10-CM | POA: Diagnosis not present

## 2020-09-19 DIAGNOSIS — Z886 Allergy status to analgesic agent status: Secondary | ICD-10-CM | POA: Diagnosis not present

## 2020-09-19 DIAGNOSIS — R519 Headache, unspecified: Secondary | ICD-10-CM | POA: Diagnosis not present

## 2020-09-19 DIAGNOSIS — M542 Cervicalgia: Secondary | ICD-10-CM | POA: Diagnosis not present

## 2020-09-19 DIAGNOSIS — I110 Hypertensive heart disease with heart failure: Secondary | ICD-10-CM | POA: Diagnosis not present

## 2021-02-27 DEATH — deceased

## 2021-05-04 IMAGING — DX DG CHEST 1V PORT
1 series · 1 of 1 positions shown · non-contrast
Comparison: Portable exam 4022 hours without prior exams available
for comparison

CLINICAL DATA: Chest pain, STEMI, ventricular tachycardia,
defibrillator discharge

EXAM:
PORTABLE CHEST 1 VIEW

[chest ap]
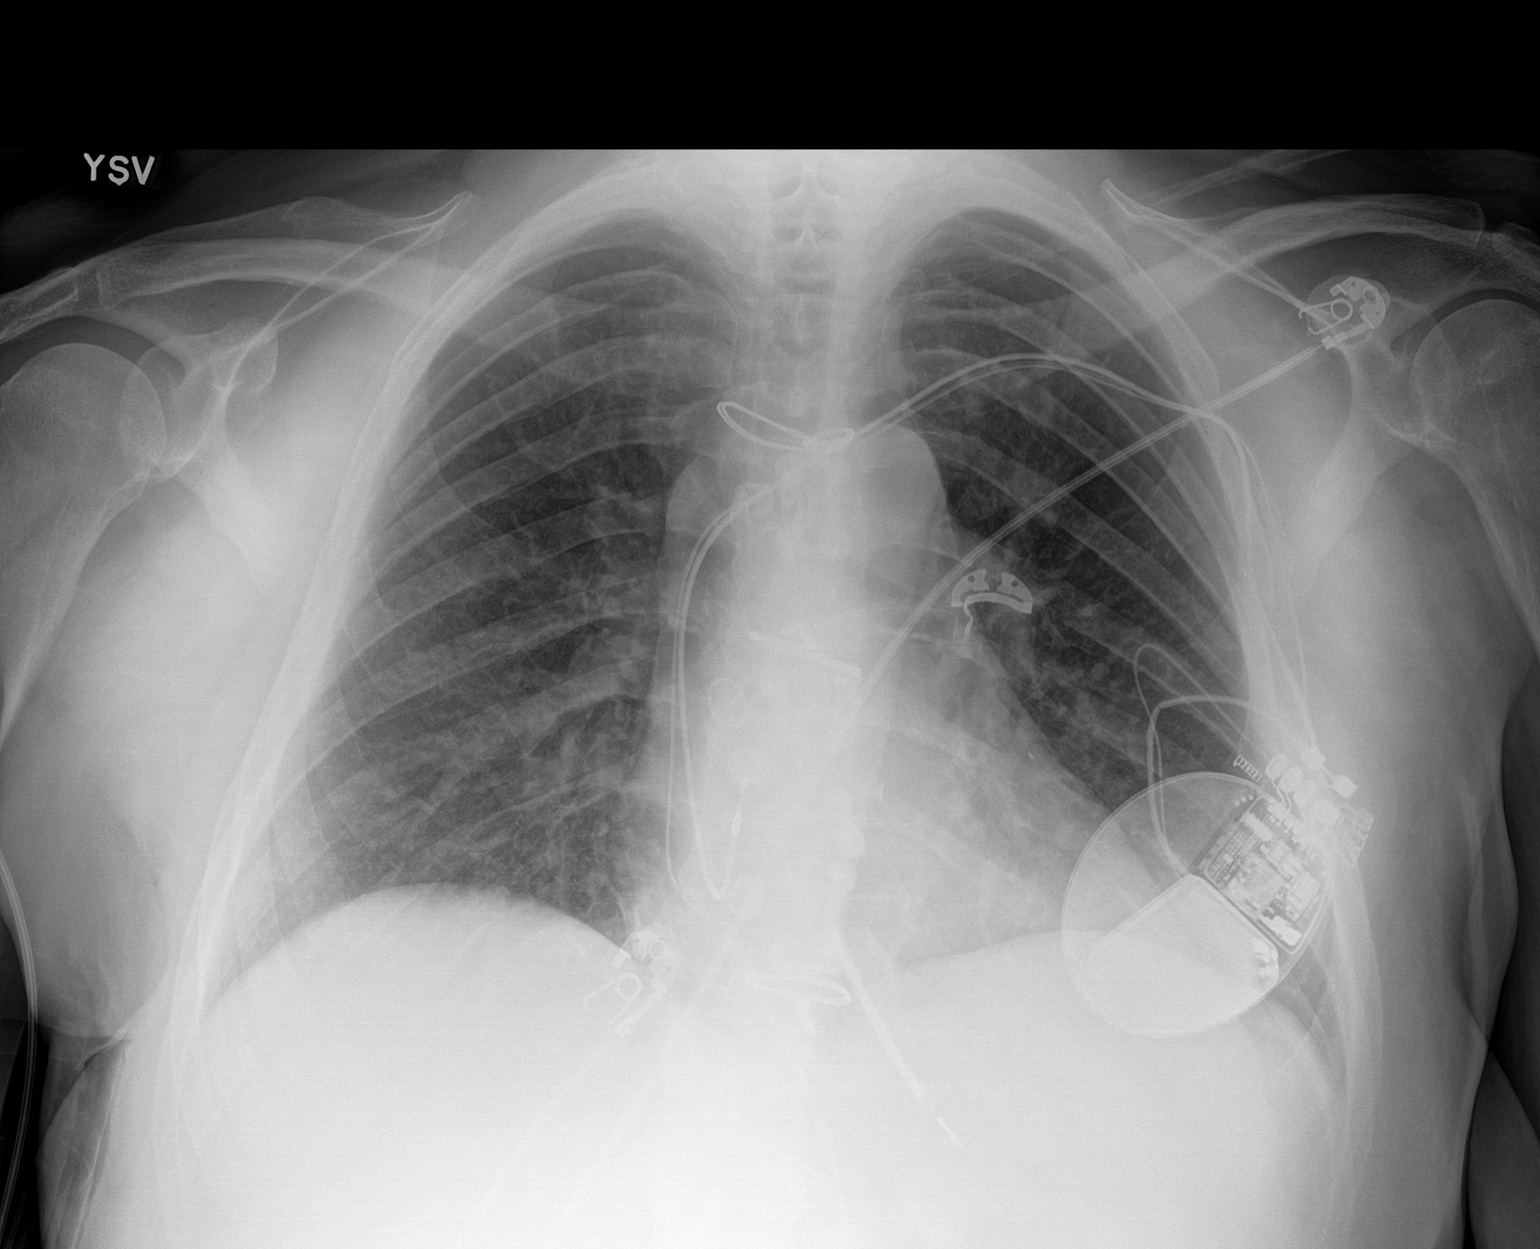

[1 of 1 positions shown; findings below may reference images not displayed]

FINDINGS: LEFT subclavian AICD with leads projecting over RIGHT atrium and
RIGHT ventricle.

Normal heart size, mediastinal contours, and pulmonary vascularity.

Lungs clear.

No infiltrate, pleural effusion, or pneumothorax.

Osseous structures unremarkable.
IMPRESSION: No acute abnormalities.

## 2021-05-05 IMAGING — DX DG CHEST 1V PORT
1 series · 1 of 1 positions shown · non-contrast
Comparison: Chest x-ray dated 05/31/2019.

CLINICAL DATA: Chest pain. Defibrillator.

EXAM:
PORTABLE CHEST 1 VIEW

[chest ap]
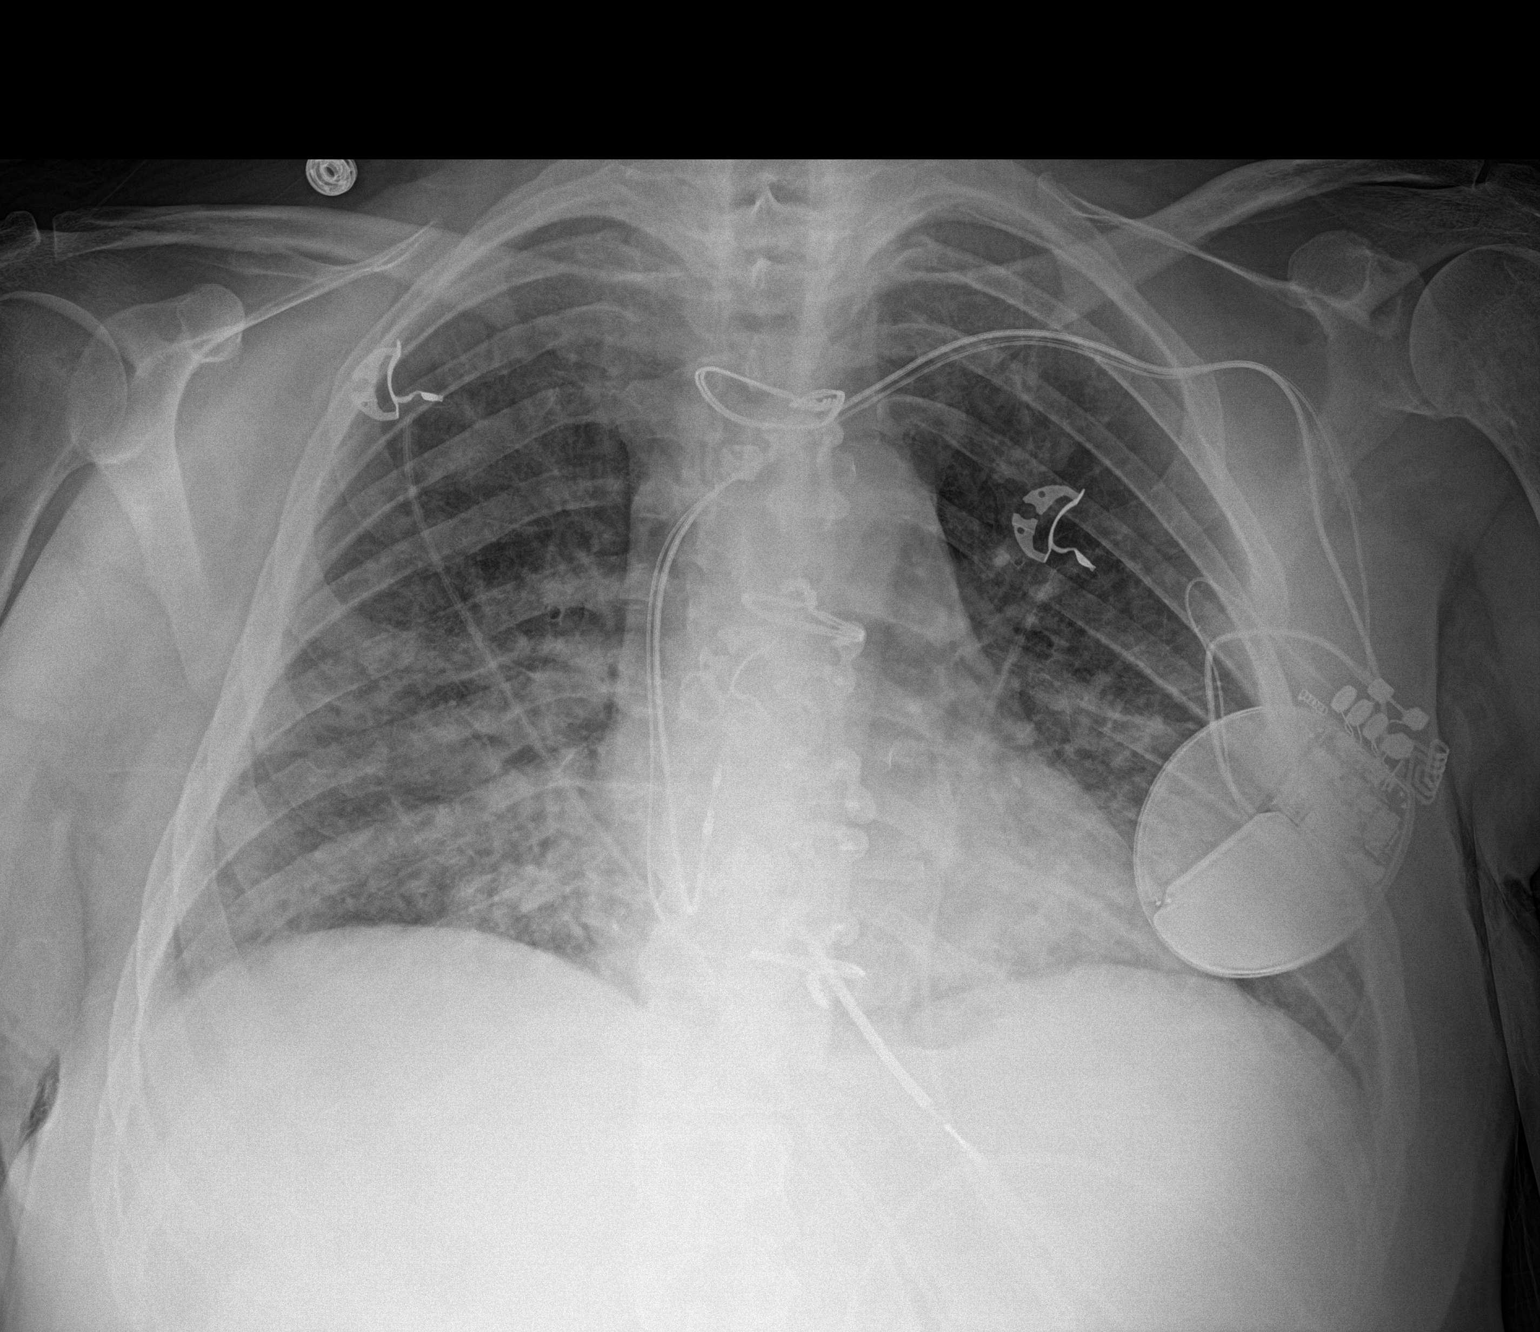

[1 of 1 positions shown; findings below may reference images not displayed]

FINDINGS: New patchy bilateral airspace opacities, lower lobe predominant. No
pleural effusion or pneumothorax is seen. Heart size and mediastinal
contours are stable. LEFT chest wall pacemaker/ICD apparatus in
place.
IMPRESSION: New patchy bilateral airspace opacities, lower lobe predominant,
most likely pulmonary edema. Pneumonia cannot be excluded if
febrile.
